# Patient Record
Sex: Female | Born: 1955 | Race: White | Hispanic: No | Marital: Single | Smoking: Never smoker
Health system: Southern US, Community
[De-identification: ages and names within clinical notes are randomized; demographics above are authoritative.]

## PROBLEM LIST (undated history)

## (undated) DIAGNOSIS — M810 Age-related osteoporosis without current pathological fracture: Secondary | ICD-10-CM

## (undated) DIAGNOSIS — G4730 Sleep apnea, unspecified: Secondary | ICD-10-CM

## (undated) DIAGNOSIS — E119 Type 2 diabetes mellitus without complications: Secondary | ICD-10-CM

## (undated) DIAGNOSIS — E079 Disorder of thyroid, unspecified: Secondary | ICD-10-CM

## (undated) DIAGNOSIS — J45909 Unspecified asthma, uncomplicated: Secondary | ICD-10-CM

## (undated) HISTORY — DX: Sleep apnea, unspecified: G47.30

## (undated) HISTORY — DX: Type 2 diabetes mellitus without complications: E11.9

## (undated) HISTORY — DX: Age-related osteoporosis without current pathological fracture: M81.0

## (undated) HISTORY — PX: TONSILLECTOMY AND ADENOIDECTOMY: SUR1326

## (undated) HISTORY — PX: APPENDECTOMY: SHX54

## (undated) HISTORY — PX: VEIN SURGERY: SHX48

## (undated) HISTORY — DX: Unspecified asthma, uncomplicated: J45.909

## (undated) HISTORY — PX: BREAST SURGERY: SHX581

## (undated) HISTORY — DX: Disorder of thyroid, unspecified: E07.9

## (undated) HISTORY — PX: ABDOMINAL HYSTERECTOMY: SHX81

---

## 2005-02-11 ENCOUNTER — Ambulatory Visit: Payer: Self-pay

## 2005-06-05 ENCOUNTER — Encounter: Payer: Self-pay | Admitting: Otolaryngology

## 2005-06-07 ENCOUNTER — Ambulatory Visit: Payer: Self-pay

## 2005-06-25 ENCOUNTER — Encounter: Payer: Self-pay | Admitting: Otolaryngology

## 2005-07-26 ENCOUNTER — Encounter: Payer: Self-pay | Admitting: Otolaryngology

## 2005-08-24 ENCOUNTER — Emergency Department: Payer: Self-pay | Admitting: Emergency Medicine

## 2005-08-24 ENCOUNTER — Other Ambulatory Visit: Payer: Self-pay

## 2005-09-02 ENCOUNTER — Ambulatory Visit: Payer: Self-pay | Admitting: Emergency Medicine

## 2005-09-02 IMAGING — US US CAROTID DUPLEX BILAT
1 series · 17 of 24 positions shown · non-contrast
Comparison: none

REASON FOR EXAM: Syncope
COMMENTS:

[Series 1: us carotid duplex bilat · 17 of 63 slices shown]
[im 1/63]
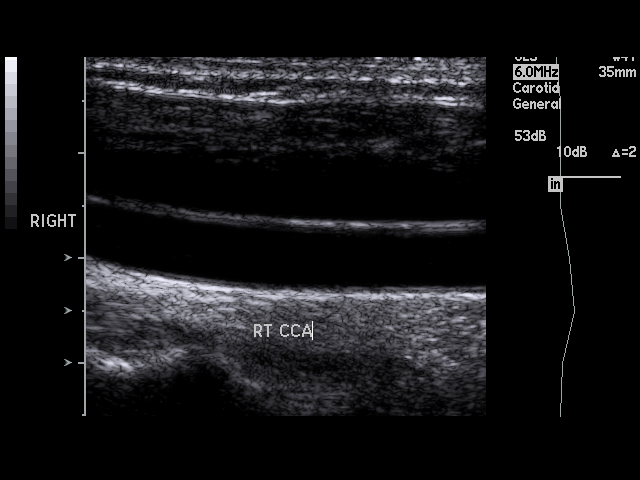
[im 6/63]
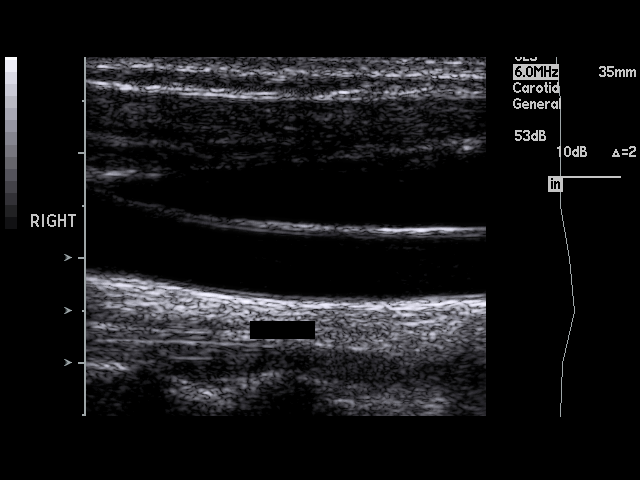
[im 9/63]
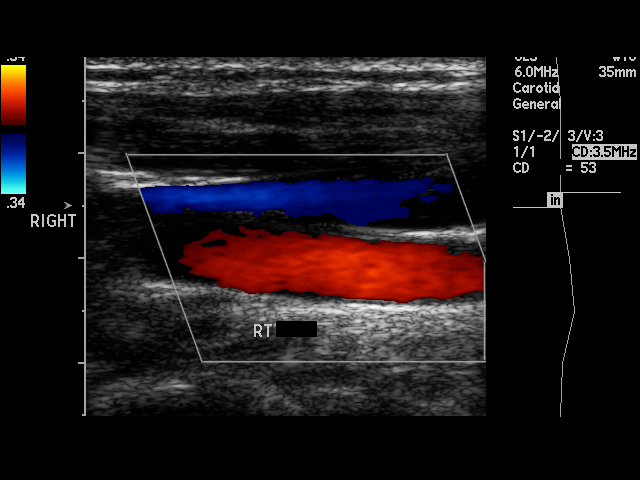
[im 11/63]
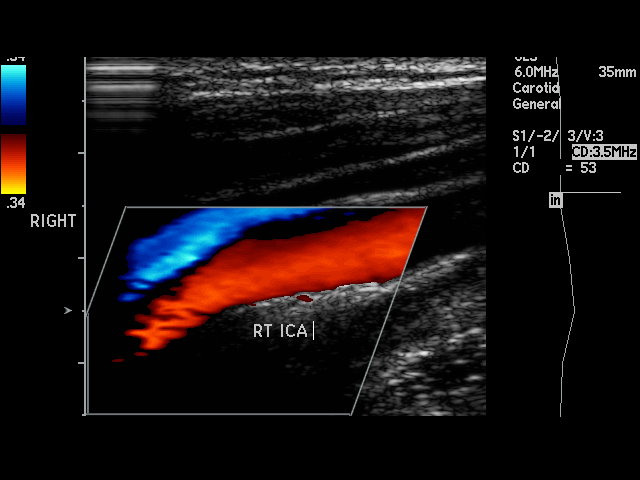
[im 17/63]
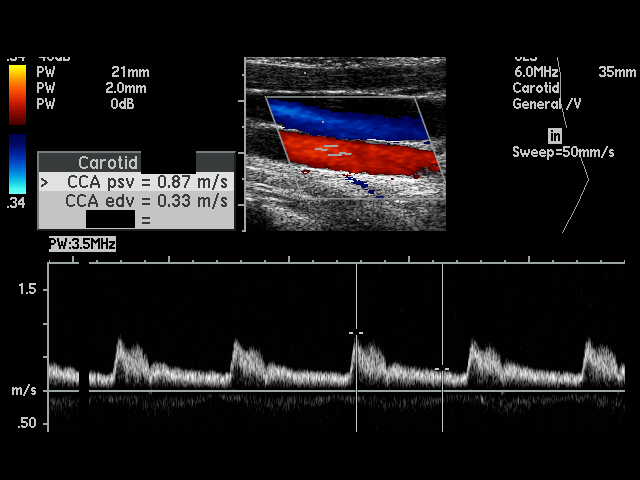
[im 19/63]
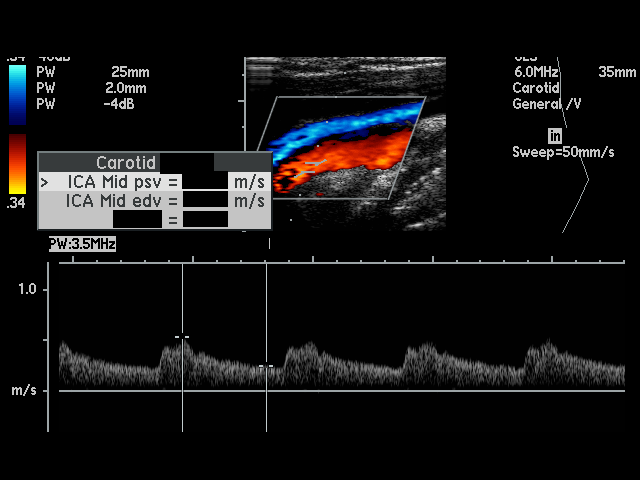
[im 25/63]
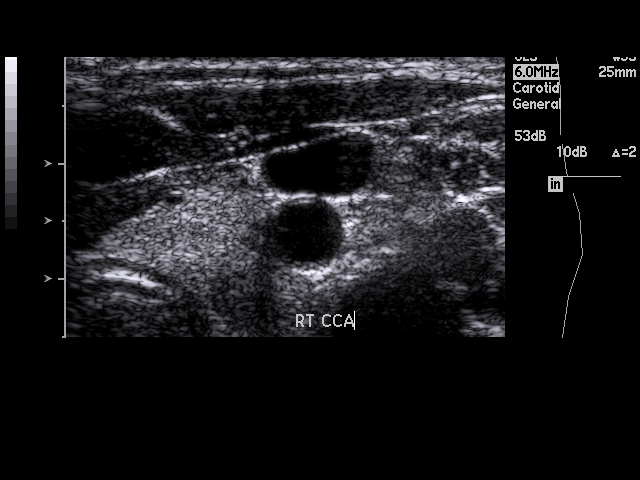
[im 27/63]
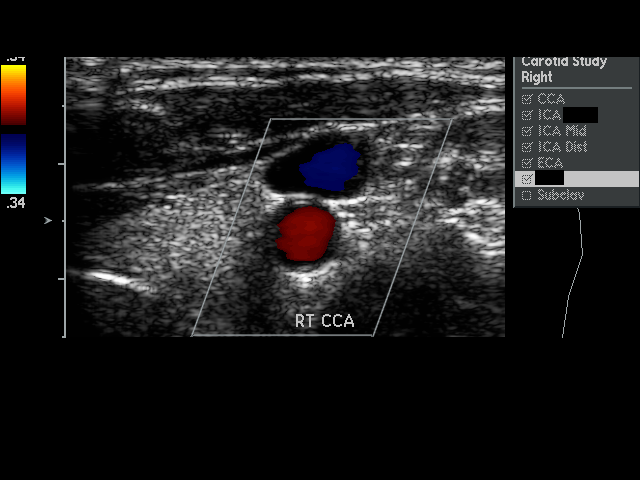
[im 33/63]
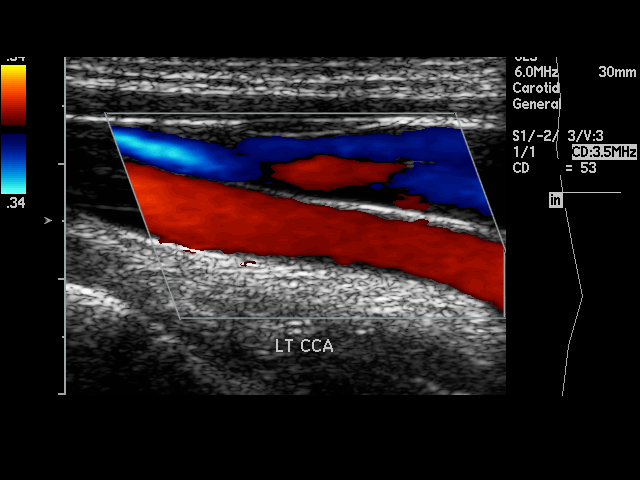
[im 36/63]
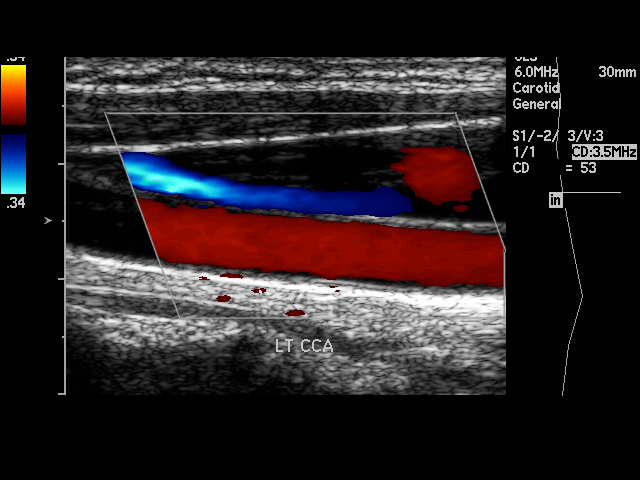
[im 38/63]
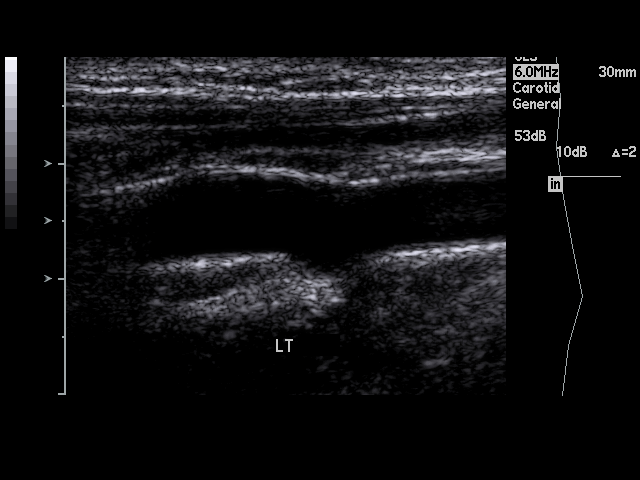
[im 44/63]
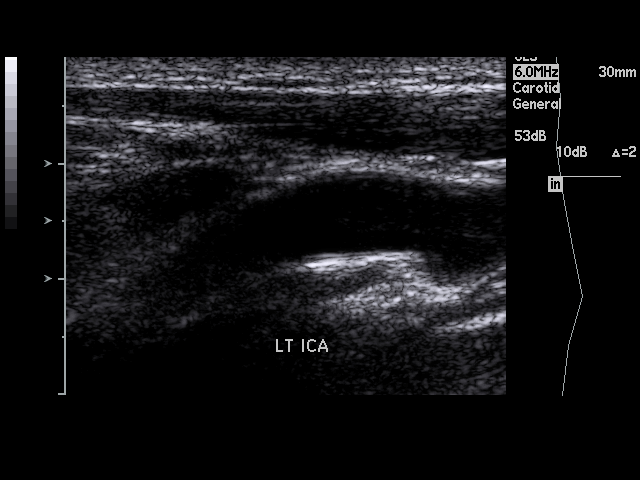
[im 46/63]
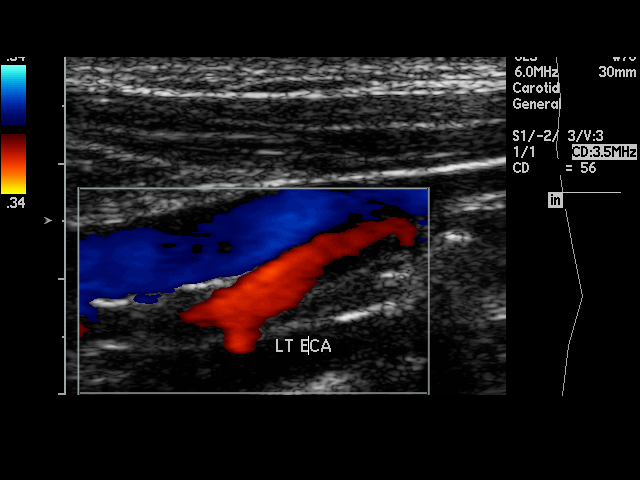
[im 52/63]
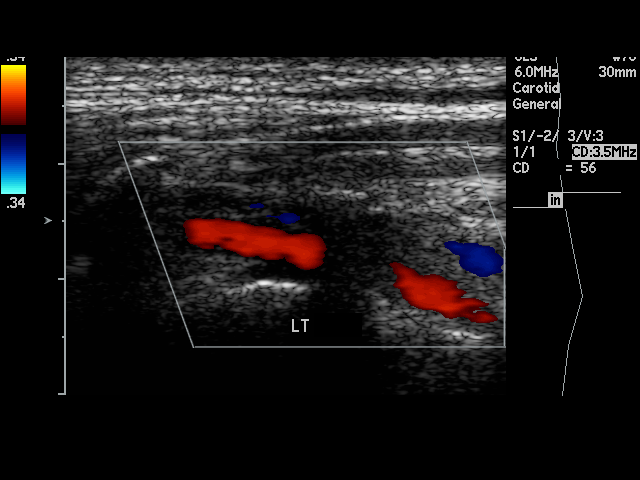
[im 54/63]
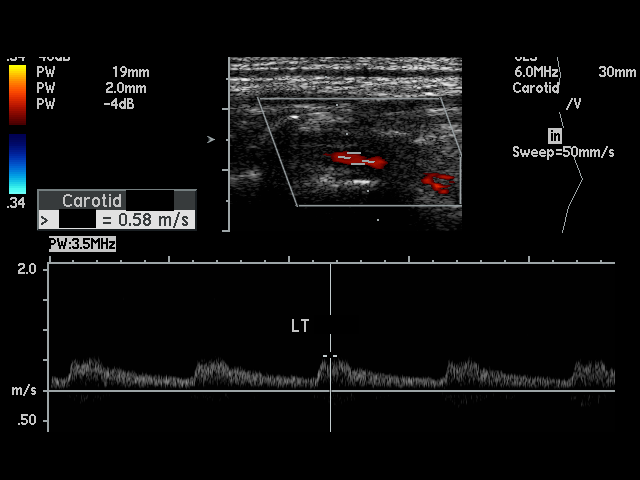
[im 57/63]
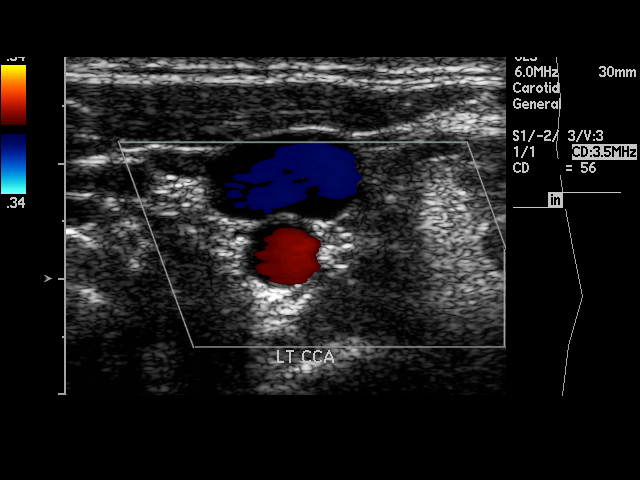
[im 63/63]
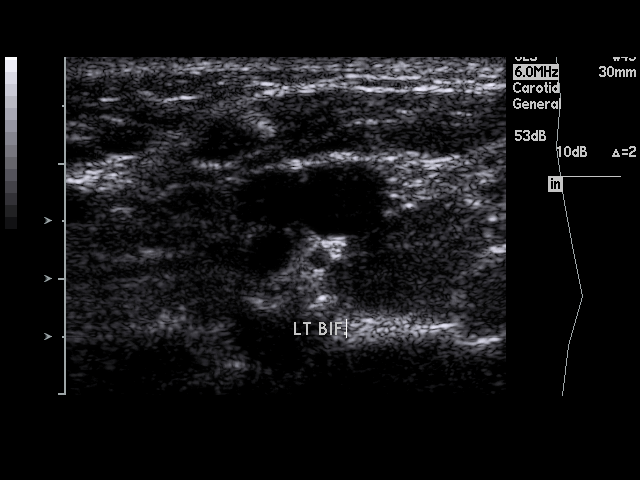

[17 of 24 positions shown; findings below may reference images not displayed]

PROCEDURE:     US  - US CAROTID DOPPLER BILATERAL  - [DATE] [DATE]

RESULT:     No definite plaque formation is seen on either side.  On the
RIGHT the peak RIGHT common carotid artery flow velocity measures .870
meters per second and the peak RIGHT internal carotid artery flow velocity
measures .577 meters per second.  ICA over CCA ratio is 0.663.  On the LEFT
the peak LEFT common carotid artery flow velocity measures .810 meters per
second and the peak LEFT internal carotid artery flow velocity measures .710
meters per second.  ICA over CCA ratio is .877.  These values bilaterally
are in the normal range and consistent with the absence of hemodynamically
significant stenosis.

Antegrade flow is noted in both vertebrals.
IMPRESSION: No plaque formation or stenosis is seen on either side.

Antegrade flow is noted in both vertebrals.

## 2006-03-05 ENCOUNTER — Ambulatory Visit: Payer: Self-pay

## 2007-04-20 ENCOUNTER — Ambulatory Visit: Payer: Self-pay

## 2007-07-31 ENCOUNTER — Ambulatory Visit: Payer: Self-pay | Admitting: Gastroenterology

## 2008-06-01 ENCOUNTER — Ambulatory Visit: Payer: Self-pay | Admitting: Unknown Physician Specialty

## 2008-06-07 ENCOUNTER — Ambulatory Visit: Payer: Self-pay | Admitting: Unknown Physician Specialty

## 2009-06-28 ENCOUNTER — Ambulatory Visit: Payer: Self-pay | Admitting: Unknown Physician Specialty

## 2009-07-10 ENCOUNTER — Ambulatory Visit: Payer: Self-pay | Admitting: Unknown Physician Specialty

## 2009-10-11 ENCOUNTER — Ambulatory Visit: Payer: Self-pay | Admitting: Unknown Physician Specialty

## 2009-10-26 ENCOUNTER — Ambulatory Visit: Payer: Self-pay | Admitting: Unknown Physician Specialty

## 2010-07-11 ENCOUNTER — Ambulatory Visit: Payer: Self-pay | Admitting: Unknown Physician Specialty

## 2011-07-20 ENCOUNTER — Ambulatory Visit: Payer: Self-pay | Admitting: Neurology

## 2011-09-19 ENCOUNTER — Ambulatory Visit: Payer: Self-pay | Admitting: Unknown Physician Specialty

## 2012-07-07 ENCOUNTER — Ambulatory Visit: Payer: Self-pay | Admitting: Internal Medicine

## 2012-07-07 LAB — CBC WITH DIFFERENTIAL/PLATELET
Basophil #: 0 10*3/uL (ref 0.0–0.1)
Basophil %: 0.3 %
Eosinophil #: 0.1 10*3/uL (ref 0.0–0.7)
HGB: 15.2 g/dL (ref 12.0–16.0)
MCH: 28.9 pg (ref 26.0–34.0)
MCHC: 33.6 g/dL (ref 32.0–36.0)
Monocyte #: 0.6 x10 3/mm (ref 0.2–0.9)
Neutrophil #: 5.1 10*3/uL (ref 1.4–6.5)
Neutrophil %: 74.3 %
Platelet: 173 10*3/uL (ref 150–440)
RBC: 5.27 10*6/uL — ABNORMAL HIGH (ref 3.80–5.20)
RDW: 12.4 % (ref 11.5–14.5)

## 2012-07-07 LAB — COMPREHENSIVE METABOLIC PANEL
Albumin: 3.6 g/dL (ref 3.4–5.0)
Alkaline Phosphatase: 118 U/L (ref 50–136)
Anion Gap: 7 (ref 7–16)
BUN: 14 mg/dL (ref 7–18)
Bilirubin,Total: 0.6 mg/dL (ref 0.2–1.0)
Co2: 30 mmol/L (ref 21–32)
Creatinine: 0.79 mg/dL (ref 0.60–1.30)
EGFR (Non-African Amer.): >60
Glucose: 95 mg/dL (ref 65–99)
Osmolality: 274 (ref 275–301)
SGPT (ALT): 23 U/L (ref 12–78)
Sodium: 137 mmol/L (ref 136–145)
Total Protein: 7.3 g/dL (ref 6.4–8.2)

## 2012-09-23 ENCOUNTER — Ambulatory Visit: Payer: Self-pay

## 2013-12-23 ENCOUNTER — Ambulatory Visit: Payer: Self-pay

## 2015-02-24 ENCOUNTER — Encounter: Payer: Self-pay | Admitting: Podiatry

## 2015-02-24 ENCOUNTER — Ambulatory Visit (INDEPENDENT_AMBULATORY_CARE_PROVIDER_SITE_OTHER): Payer: BC Managed Care – PPO | Admitting: Podiatry

## 2015-02-24 ENCOUNTER — Ambulatory Visit (INDEPENDENT_AMBULATORY_CARE_PROVIDER_SITE_OTHER): Payer: BC Managed Care – PPO

## 2015-02-24 VITALS — BP 118/70 | HR 62 | Resp 16 | Ht 63.75 in | Wt 158.0 lb

## 2015-02-24 DIAGNOSIS — M779 Enthesopathy, unspecified: Secondary | ICD-10-CM

## 2015-02-24 DIAGNOSIS — M2011 Hallux valgus (acquired), right foot: Secondary | ICD-10-CM

## 2015-02-24 MED ORDER — TRIAMCINOLONE ACETONIDE 10 MG/ML IJ SUSP
10.0000 mg | Freq: Once | INTRAMUSCULAR | Status: AC
  Administered 2015-02-24: 10 mg

## 2015-02-24 NOTE — Progress Notes (Signed)
   Subjective:    Patient ID: Kelsey Howard, female    DOB: 07/14/1956, 59 y.o.   MRN: [id number]  HPI right foot having a ache, that has been going on for years. Right foot bunion     Review of Systems  All other systems reviewed and are negative.      Objective:   Physical Exam        Assessment & Plan:

## 2015-02-27 NOTE — Progress Notes (Signed)
Subjective:     Patient ID: Kelsey Howard, female   DOB: 03/13/1956, 59 y.o.   MRN: [id number]  HPI patient presents stating I have this structural bunion deformity right which is becoming more bothersome and I'm having a lot of pain on the inside of the joint with no history of injury. States it's been present for several months and the achiness around the first metatarsal head has been present for a fairly long time   Review of Systems  All other systems reviewed and are negative.      Objective:   Physical Exam  Constitutional: She is oriented to person, place, and time.  Cardiovascular: Intact distal pulses.   Musculoskeletal: Normal range of motion.  Neurological: She is oriented to person, place, and time.  Skin: Skin is warm.  Nursing note and vitals reviewed.  neurovascular status intact muscle strength adequate with range of motion subtalar and midtarsal joint within normal limits. Patient's noted to have hyperostosis medial aspect first metatarsal head right that's painful when pressed and makes shoe gear difficult and is red upon palpation with pain. 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 I do think with an aggressive Austin-type bunionectomy we could do a good job of helping with this problem and I explained surgery and the fact we would not get total correction but would do well. She wants to do this but needs to wait until summer and at this time I did do a careful injection of the first MPJ 3 mg Kenalog 5 mg Xylocaine lateral side and I want to reevaluate in several weeks and see the results and also discussed more the structural bunion procedure  necessary

## 2015-03-24 ENCOUNTER — Ambulatory Visit (INDEPENDENT_AMBULATORY_CARE_PROVIDER_SITE_OTHER): Payer: BC Managed Care – PPO | Admitting: Podiatry

## 2015-03-24 ENCOUNTER — Encounter: Payer: Self-pay | Admitting: Podiatry

## 2015-03-24 VITALS — BP 128/76 | HR 62 | Resp 16

## 2015-03-24 DIAGNOSIS — M2011 Hallux valgus (acquired), right foot: Secondary | ICD-10-CM | POA: Diagnosis not present

## 2015-03-25 NOTE — Progress Notes (Signed)
Subjective:     Patient ID: Kelsey CootsLinda H Howard, female   DOB: 01/18/1956, 59 y.o.   MRN: [id number]  HPI patient states the pain has subsided but I'm still concerned about the structural bunion deformity and I would like to pursue correction. States that it bothers her in shoe gear and is gradually getting worse   Review of Systems     Objective:   Physical Exam Neurovascular status intact with muscle strength adequate and range of motion within normal limits. Patient's first MPJ the inflammation present previously has reduced some with the injection treatment but she continues to have discomfort on the medial side with a prominence noted and deviation of the hallux against the second toe    Assessment:     Structural HAV deformity right with redness and pain around the head that improved some with injection treatment but remains symptomatic    Plan:     Reviewed condition with patient and discussed treatment options. Her IM angle indicates the thought of a more proximal osteotomy or aggressive distal osteotomy with the expectation that full correction would not be achieved, would probably reduced quite a bit of the symptoms she experiences. I will no longer be full time in the [place] office and I'm going to refer this patient to Dr. Al CorpusHyatt for review and consideration of surgical intervention

## 2015-03-29 ENCOUNTER — Ambulatory Visit (INDEPENDENT_AMBULATORY_CARE_PROVIDER_SITE_OTHER): Payer: BC Managed Care – PPO | Admitting: Podiatry

## 2015-03-29 ENCOUNTER — Encounter: Payer: Self-pay | Admitting: Podiatry

## 2015-03-29 VITALS — Ht 63.0 in | Wt 158.0 lb

## 2015-03-29 DIAGNOSIS — M2011 Hallux valgus (acquired), right foot: Secondary | ICD-10-CM | POA: Diagnosis not present

## 2015-03-29 NOTE — Patient Instructions (Signed)
Pre-Operative Instructions  Congratulations, you have decided to take an important step to improving your quality of life.  You can be assured that the doctors of [facility] will be with you every step of the way.  1. Plan to be at the surgery center/hospital at least 1 (one) hour prior to your scheduled time unless otherwise directed by the surgical center/hospital staff.  You must have a responsible adult accompany you, remain during the surgery and drive you home.  Make sure you have directions to the surgical center/hospital and know how to get there on time. 2. For hospital based surgery you will need to obtain a history and physical form from your family physician within 1 month prior to the date of surgery- we will give you a form for you primary physician.  3. We make every effort to accommodate the date you request for surgery.  There are however, times where surgery dates or times have to be moved.  We will contact you as soon as possible if a change in schedule is required.   4. No Aspirin/Ibuprofen for one week before surgery.  If you are on aspirin, any non-steroidal anti-inflammatory medications (Mobic, Aleve, Ibuprofen) you should stop taking it 7 days prior to your surgery.  You make take Tylenol  For pain prior to surgery.  5. Medications- If you are taking daily heart and blood pressure medications, seizure, reflux, allergy, asthma, anxiety, pain or diabetes medications, make sure the surgery center/hospital is aware before the day of surgery so they may notify you which medications to take or avoid the day of surgery. 6. No food or drink after midnight the night before surgery unless directed otherwise by surgical center/hospital staff. 7. No alcoholic beverages 24 hours prior to surgery.  No smoking 24 hours prior to or 24 hours after surgery. 8. Wear loose pants or shorts- loose enough to fit over bandages, boots, and casts. 9. No slip on shoes, sneakers are best. 10. Bring  your boot with you to the surgery center/hospital.  Also bring crutches or a walker if your physician has prescribed it for you.  If you do not have this equipment, it will be provided for you after surgery. 11. If you have not been contracted by the surgery center/hospital by the day before your surgery, call to confirm the date and time of your surgery. 12. Leave-time from work may vary depending on the type of surgery you have.  Appropriate arrangements should be made prior to surgery with your employer. 13. Prescriptions will be provided immediately following surgery by your doctor.  Have these filled as soon as possible after surgery and take the medication as directed. 14. Remove nail polish on the operative foot. 15. Wash the night before surgery.  The night before surgery wash the foot and leg well with the antibacterial soap provided and water paying special attention to beneath the toenails and in between the toes.  Rinse thoroughly with water and dry well with a towel.  Perform this wash unless told not to do so by your physician.  Enclosed: 1 Ice pack (please put in freezer the night before surgery)   1 Hibiclens skin cleaner   Pre-op Instructions  If you have any questions regarding the instructions, do not hesitate to call our office.  [place]: [address] [place], [place] [address] [phone]  [place]: [address] [place], [place] [address] [phone]  [place]: [address]  [place], [place] [address] [phone]  Dr. Richard   Tuchman DPM, Dr. Norman Regal DPM Dr. Richard Sikora DPM, Dr. M. Todd Hyatt DPM, Dr. Kathryn Egerton DPM 

## 2015-03-29 NOTE — Progress Notes (Signed)
She presents today for surgical consult regarding her bunion of her right foot. She states that this seems to be getting worse as time goes on his causing crowding of my lesser toes and I am developing sores between my toes. She states that she can hardly wear shoes that fit comfortably secondary to her severe deformity.  Objective: Vital signs are stable she is alert and oriented 3. Pulses are strongly palpable right foot. 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 Osteopenia is also noted. Patient does have a history of osteoporosis.  Assessment: Hallux abductovalgus deformity right foot. With pain associated with osteoarthritic changes.  Plan: Discussed etiology pathology conservative versus surgical therapies. We did discuss an [facility] bunion repair with screw fixation today. We went over the consent form line by line number by number giving her ample time to ask questions she saw fit regarding these procedures. I answered all of the questions regarding these procedures to the best of my ability in layman's terms. She understood it was amenable to it and find all 3 cages of the consent form. We dispensed a cam walker for her postop period. I will follow-up with her in the near future for surgical intervention.

## 2015-04-05 ENCOUNTER — Telehealth: Payer: Self-pay | Admitting: *Deleted

## 2015-04-05 NOTE — Telephone Encounter (Signed)
Called pt, and spoke to pt about her benefits.

## 2015-04-05 NOTE — Telephone Encounter (Signed)
Pt states she is returning a call from the [facility].

## 2015-04-05 NOTE — Telephone Encounter (Signed)
Jennifer-did you call this patient regarding surgical benefits?

## 2015-05-08 ENCOUNTER — Telehealth: Payer: Self-pay | Admitting: *Deleted

## 2015-05-08 NOTE — Telephone Encounter (Signed)
"  I haven't heard anything from the surgical center about my surgery.  Is there anything I need to do?  I'm scheduled for 05/19/2015."  No, the surgical center will call you a day or two before surgery date and let you know what time to be there.  "I know that.  Is there not any blood work or anything I need to do before hand?"  No blood work is needed.  "Well how do they know my blood type is if I need a blood transfusion or something?"  You will not need a blood transfusion for the type of procedure you are having.  You will need to fill out the information for the surgical center on-line.  "I don't have that information.  All I have is this brochure, nail scrub and ice pack."  The information for the surgical center is located in the brochure.  "Oh, I guest I should have checked the bag out more.  Thank you."

## 2015-05-17 ENCOUNTER — Other Ambulatory Visit: Payer: Self-pay | Admitting: Podiatry

## 2015-05-17 MED ORDER — OXYCODONE-ACETAMINOPHEN 10-325 MG PO TABS: 1.0000 | ORAL_TABLET | ORAL | Status: DC | PRN

## 2015-05-17 MED ORDER — CLINDAMYCIN HCL 150 MG PO CAPS: 150.0000 mg | ORAL_CAPSULE | Freq: Three times a day (TID) | ORAL | Status: DC

## 2015-05-17 MED ORDER — PROMETHAZINE HCL 25 MG PO TABS: 25.0000 mg | ORAL_TABLET | Freq: Three times a day (TID) | ORAL | Status: DC | PRN

## 2015-05-19 DIAGNOSIS — M2011 Hallux valgus (acquired), right foot: Secondary | ICD-10-CM | POA: Diagnosis not present

## 2015-05-22 ENCOUNTER — Telehealth: Payer: Self-pay | Admitting: *Deleted

## 2015-05-22 NOTE — Telephone Encounter (Signed)
I spoke with pt she states this weekend the pain medication gave her a rash and she called the on-call doctor, who told her to take OTC Tylenol or Ibuprofen for pain.  Pt states she took 1/2 of the pain medication and the nausea medication and is very confused and tired.  I asked the pt if she felt her pain could be managed with the Ibuprofen, and she said she could take Ibuprofen 800mg  3 times a day and it helped, I told her if she continued to have a itching rash to OTC Benadryl as directed.  I told pt I would inform Dr. Al Corpus of her medication change and for her to call with concerns.

## 2015-05-22 NOTE — Telephone Encounter (Signed)
fyi

## 2015-05-24 ENCOUNTER — Ambulatory Visit (INDEPENDENT_AMBULATORY_CARE_PROVIDER_SITE_OTHER): Payer: BC Managed Care – PPO | Admitting: Podiatry

## 2015-05-24 ENCOUNTER — Ambulatory Visit (INDEPENDENT_AMBULATORY_CARE_PROVIDER_SITE_OTHER): Payer: BC Managed Care – PPO

## 2015-05-24 VITALS — BP 122/82 | HR 69 | Temp 97.1°F | Resp 16

## 2015-05-24 DIAGNOSIS — Z9889 Other specified postprocedural states: Secondary | ICD-10-CM

## 2015-05-24 DIAGNOSIS — M2011 Hallux valgus (acquired), right foot: Secondary | ICD-10-CM

## 2015-05-25 NOTE — Progress Notes (Signed)
She presents today for follow-up of her bunion surgery performed 05/19/2015. She states that she seems to be doing pretty well even though she was unable to take the medication because of a skin reaction. She denies fever chills nausea vomiting muscle aches and pains.  Objective: Vital signs are stable she is alert and oriented 3. She presents with her Cam Walker and a dry sterile compressive dressing. Once the dressing was removed demonstrates minimal edema no erythema cellulitis drainage or odor. She has a good range of motion of the first metatarsal phalangeal joint of the right foot. Read vascular confirm nice placement of the osteotomy with good screw fixation.  Assessment: Well-healing surgical foot status post one week.  Plan: Redressed the right foot today with a dry sterile compressive dressing encouraged her to continue range of motion exercises and placed her back in her Cam Walker. Follow up with her in 1 week

## 2015-05-31 ENCOUNTER — Encounter: Payer: BC Managed Care – PPO | Admitting: Podiatry

## 2015-06-02 ENCOUNTER — Ambulatory Visit (INDEPENDENT_AMBULATORY_CARE_PROVIDER_SITE_OTHER): Payer: BC Managed Care – PPO | Admitting: Podiatry

## 2015-06-02 VITALS — BP 127/76 | HR 68 | Temp 97.3°F | Resp 16

## 2015-06-02 DIAGNOSIS — Z9889 Other specified postprocedural states: Secondary | ICD-10-CM

## 2015-06-02 NOTE — Progress Notes (Signed)
She presents today status post [facility] with screw fixation right foot. She states that she has some stabbing pain when she is on it and the worst part is the boot rubs the back of her heel. She denies fever chills nausea vomiting muscle aches and pains.  Objective: Vital signs are stable alert and oriented 3. Dry sterile dressing was removed demonstrated some green drainage at the incision site where the area appears to be overall due to maceration and blistering. This is more than likely associated with the dressing itself. I see no signs of infection and that there is no erythema no cellulitis drainage or odor actively draining.  Assessment: Well-healing surgical foot right on the leave the stitches in the cause of the blistering and the maceration.  Plan: Redress today with a Betadine dressing and encouraged her to increase range of motion. Keep this clean and dry dispensed a Darco shoe and I will follow-up with her in 2 weeks.

## 2015-06-08 ENCOUNTER — Telehealth: Payer: Self-pay | Admitting: *Deleted

## 2015-06-08 MED ORDER — CLINDAMYCIN HCL 300 MG PO CAPS: 300.0000 mg | ORAL_CAPSULE | Freq: Three times a day (TID) | ORAL | Status: DC

## 2015-06-08 NOTE — Telephone Encounter (Signed)
OFFERED HER AN APPOINTMENT IN [place], SHE STATED THAT SHE WILL TAKE THE ANTIBIOTIC AND WAIT TO SEE DR Al CorpusHYATT ON July 25TH

## 2015-06-08 NOTE — Telephone Encounter (Signed)
PATIENT IS HAVING REDNESS AND SWELLING , TOES STILL FEEL NUMB AND TINGLING. NOT RUNNING A FEVER , PATIENT IS CHANGING DRESSING EVERY OTHER DAY AND USING NEOSPORIN ON IT. NO DRAINAGE , BUT IT FEELS WARM , HAVE NOT GOT IT WET.

## 2015-06-08 NOTE — Telephone Encounter (Signed)
Pt left her name, DOB and phone number.

## 2015-06-19 ENCOUNTER — Ambulatory Visit (INDEPENDENT_AMBULATORY_CARE_PROVIDER_SITE_OTHER): Payer: BC Managed Care – PPO

## 2015-06-19 ENCOUNTER — Encounter: Payer: Self-pay | Admitting: Podiatry

## 2015-06-19 ENCOUNTER — Ambulatory Visit (INDEPENDENT_AMBULATORY_CARE_PROVIDER_SITE_OTHER): Payer: BC Managed Care – PPO | Admitting: Podiatry

## 2015-06-19 VITALS — BP 115/59 | HR 68 | Resp 16

## 2015-06-19 DIAGNOSIS — Z9889 Other specified postprocedural states: Secondary | ICD-10-CM

## 2015-06-19 DIAGNOSIS — M2011 Hallux valgus (acquired), right foot: Secondary | ICD-10-CM

## 2015-06-19 NOTE — Progress Notes (Signed)
She presents today 1 month status post [facility] bunion repair with screw fixation right foot. She states this seems to be doing pretty well.  Objective: Final signs are stable she is alert and oriented 3. Pulses are palpable. Minimal edema right range of motion of the first metatarsophalangeal joint. Well-healed surgical site. Remaining sutures were removed. No signs of dehiscence.  Assessment: Well-healing surgical foot one month status post [place] bunion repair with screw fixation date of surgery 05/19/2015.  Plan: Put her in a compression anklet today and suggested that she get back into a pair of tennis shoes and increase her range of motion activities. I will follow-up with her in 1 month for x-rays.

## 2015-06-21 ENCOUNTER — Encounter: Payer: Self-pay | Admitting: Podiatry

## 2015-07-06 ENCOUNTER — Telehealth: Payer: Self-pay | Admitting: *Deleted

## 2015-07-06 NOTE — Telephone Encounter (Signed)
Pt called states she is still having a lot of swelling in the joint.  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 Al Corpus would release her to a seated position or should she make an earlier appt to see what she could tell her manager she could do; I told her that would be a good idea and to also ask the manager what her work options are. Transferred pt to schedulers.

## 2015-07-17 ENCOUNTER — Ambulatory Visit (INDEPENDENT_AMBULATORY_CARE_PROVIDER_SITE_OTHER): Payer: BC Managed Care – PPO | Admitting: Podiatry

## 2015-07-17 ENCOUNTER — Encounter: Payer: Self-pay | Admitting: Podiatry

## 2015-07-17 ENCOUNTER — Ambulatory Visit (INDEPENDENT_AMBULATORY_CARE_PROVIDER_SITE_OTHER): Payer: BC Managed Care – PPO

## 2015-07-17 VITALS — BP 119/69 | HR 71 | Resp 16

## 2015-07-17 DIAGNOSIS — Z9889 Other specified postprocedural states: Secondary | ICD-10-CM

## 2015-07-17 DIAGNOSIS — M2011 Hallux valgus (acquired), right foot: Secondary | ICD-10-CM | POA: Diagnosis not present

## 2015-07-17 NOTE — Progress Notes (Signed)
She presents today for follow-up of her [facility] bunion repair right foot. She states that she is ready to get back to work this Thursday. She states that she still has some numbness and swelling in the right foot but she is able to wear her shoe.  Objective: Vital signs are stable she is alert and oriented 3 in no acute distress. Pulses are strongly palpable right. Minimal edema right foot. She has good range of motion first metatarsophalangeal joint of the right foot. Radiographs confirm an [facility] bunion repair with single screw fixation first metatarsal right foot. It appears to have healed without complications.  Assessment: Well-healing surgical foot date of surgery 05/19/2015 right.  Plan: At this point and would allow her to get back to work unrestricted she will work 45 minutes out of each hour and the remaining 15 minutes will result in elevation of the foot with icing of the foot. Follow up with her in 1 month.  Arbutus Ped DPM

## 2015-08-21 ENCOUNTER — Encounter: Payer: Self-pay | Admitting: Podiatry

## 2015-08-21 ENCOUNTER — Ambulatory Visit (INDEPENDENT_AMBULATORY_CARE_PROVIDER_SITE_OTHER): Payer: BC Managed Care – PPO | Admitting: Podiatry

## 2015-08-21 ENCOUNTER — Ambulatory Visit: Payer: Self-pay

## 2015-08-21 VITALS — BP 118/74 | HR 77 | Resp 12

## 2015-08-21 DIAGNOSIS — M2011 Hallux valgus (acquired), right foot: Secondary | ICD-10-CM

## 2015-08-21 NOTE — Progress Notes (Signed)
She presents today for her final follow-up visit regarding an [facility] bunion repair right foot. She states that she wears to compression stockings to the right foot. States that she's walks overnight and thousand steps a day. She states that initially when she went back to work it was sore but then it has progressed. She states that seems to be doing very well she has no complications.  Objective: Vital signs are stable she is alert and oriented 3. She has minimal edema no erythema cellulitis drainage or odor. Great range of motion of the first metatarsophalangeal joint. Mild hypertrophic scar overlying the incision site. Radiographs taken today 3 views of the office demonstrates well-healed surgical foot. Cutaneous evaluation demonstrates supple well-hydrated cutis no signs of infection.  Assessment: Well-healing surgical foot date of surgery 05/18/2015.  Plan: Follow-up as needed.

## 2015-09-26 ENCOUNTER — Telehealth: Payer: Self-pay | Admitting: *Deleted

## 2015-09-26 NOTE — Telephone Encounter (Signed)
Pt states she still feels like the right 1st joint hits first when she steps down while walking barefooted.  I told pt it may take 6-9 months for her to be able to walk for short periods of time barefooted, but to use a firm shoe and try short periods of barefoot walking.  Pt states understanding.

## 2015-10-09 ENCOUNTER — Telehealth: Payer: Self-pay | Admitting: *Deleted

## 2015-10-09 NOTE — Telephone Encounter (Signed)
Pt states she still can't go barefooted, she said Dr. Al CorpusHyatt states she can wear Teva sandal, which she bought but still has achiness.  I told pt I would have the scheduler call tomorrow and get her into discuss and bring her sandals.

## 2015-10-24 ENCOUNTER — Ambulatory Visit (INDEPENDENT_AMBULATORY_CARE_PROVIDER_SITE_OTHER): Payer: BC Managed Care – PPO

## 2015-10-24 ENCOUNTER — Telehealth: Payer: Self-pay | Admitting: *Deleted

## 2015-10-24 ENCOUNTER — Encounter: Payer: Self-pay | Admitting: Podiatry

## 2015-10-24 ENCOUNTER — Ambulatory Visit (INDEPENDENT_AMBULATORY_CARE_PROVIDER_SITE_OTHER): Payer: BC Managed Care – PPO | Admitting: Podiatry

## 2015-10-24 VITALS — BP 113/69 | HR 75 | Resp 16

## 2015-10-24 DIAGNOSIS — Z9889 Other specified postprocedural states: Secondary | ICD-10-CM

## 2015-10-24 DIAGNOSIS — M2011 Hallux valgus (acquired), right foot: Secondary | ICD-10-CM

## 2015-10-24 DIAGNOSIS — M2580 Other specified joint disorders, unspecified joint: Secondary | ICD-10-CM

## 2015-10-24 NOTE — Progress Notes (Signed)
She presents today for chief complaint of pain beneath first metatarsophalangeal joint of the right foot. Surgery was performed to repair a bunion to this right foot in June 2016. She states that it feels she is walking on a rock the toe does not purchase the ground. He easily.  Objective: Vital signs are stable she's alert and oriented 3. She has painful and very prominent tibial sesamoid of the hallux right. Radiographs confirm osteoarthritic changes tibial sesamoid right foot. Some early joint space narrowing also noted of the joint. Mild malleus deformity of the hallux.  Assessment: Status post bunion repair with osteoarthritic changes tibial sesamoid right foot. Hallux malleus right foot.  Plan: Discussed etiology pathology conservative versus surgical therapies. She was scanned for set of orthotics today should this fail surgery will be eminent.  Arbutus Pedodd Hyatt DPM

## 2015-10-24 NOTE — Telephone Encounter (Signed)
Pt states she forgot to get her work note.

## 2015-10-25 NOTE — Telephone Encounter (Addendum)
Pt request note for work, but did not leave information as to what the note should contain.  Left message informing pt a note could be written, once the required information was called to me.  Pt called states she just needs a note stating she was here 10/23/2105, and fax to [facility] [employer] ATTN:  Bard HerbertCandy [place] [phone]. Done.

## 2015-11-07 ENCOUNTER — Ambulatory Visit: Payer: BC Managed Care – PPO | Admitting: Podiatry

## 2015-11-16 ENCOUNTER — Ambulatory Visit: Payer: BC Managed Care – PPO | Admitting: *Deleted

## 2015-11-16 DIAGNOSIS — M779 Enthesopathy, unspecified: Secondary | ICD-10-CM

## 2015-11-16 NOTE — Patient Instructions (Signed)

## 2015-11-16 NOTE — Progress Notes (Signed)
Patient ID: Marliss CootsLinda H Warchol, female   DOB: 10/16/1956, 59 y.o.   MRN: [id number] Patient presents for orthotic pick up.  Verbal and written break in and wear instructions given.  Patient will follow up in 4 weeks if symptoms worsen or fail to improve.

## 2020-01-05 ENCOUNTER — Ambulatory Visit
Admission: EM | Admit: 2020-01-05 | Discharge: 2020-01-05 | Disposition: A | Discharge status: Home / Self Care | Payer: BC Managed Care – PPO | Attending: Urgent Care | Admitting: Urgent Care

## 2020-01-05 ENCOUNTER — Other Ambulatory Visit: Payer: Self-pay

## 2020-01-05 DIAGNOSIS — N390 Urinary tract infection, site not specified: Secondary | ICD-10-CM | POA: Insufficient documentation

## 2020-01-05 DIAGNOSIS — F4321 Adjustment disorder with depressed mood: Secondary | ICD-10-CM | POA: Diagnosis present

## 2020-01-05 LAB — URINALYSIS, COMPLETE (UACMP) WITH MICROSCOPIC
Bilirubin Urine: NEGATIVE
Glucose, UA: NEGATIVE mg/dL
Ketones, ur: 15 mg/dL — AB
Nitrite: NEGATIVE
Protein, ur: 100 mg/dL — AB
Specific Gravity, Urine: 1.015 (ref 1.005–1.030)
WBC, UA: >50 WBC/hpf (ref 0–5)
pH: 7 (ref 5.0–8.0)

## 2020-01-05 MED ORDER — NITROFURANTOIN MONOHYD MACRO 100 MG PO CAPS: 100.0000 mg | ORAL_CAPSULE | Freq: Two times a day (BID) | ORAL | 0 refills | Status: DC

## 2020-01-05 NOTE — ED Triage Notes (Signed)
Pt presents with c/o urinary urgency, decreased output, incontinence x2, lower abdominal pressure. Pt has been taking Azo since last night and that has helped some. She denies fever/chills, n/v/d, hematuria or dysuria.

## 2020-01-05 NOTE — Discharge Instructions (Addendum)
It was very nice seeing you today in clinic. Thank you for entrusting me with your care.   As discussed, your urine is POSITIVE for infection. Will approach treatment as follows:  Prescription has been sent to your pharmacy for antibiotics.  Please pick up and take as directed. FINISH the entire course of medication even if you are feeling better.  A culture will be sent on your provided sample. If it comes back resistant to what I have prescribed you, someone will call you and let you know that we will need to change antibiotics. Increase fluid intake as much as possible to flush your urinary tract.  Water is always the best.  Avoid caffeine until your infection clears up, as it can contribute to painful bladder spasms.  May use Tylenol and/or Ibuprofen as needed for pain/fever. May use Azo products (over the counter) to help with pain/discomfort.   Make arrangements to follow up with your regular doctor in 1 week for re-evaluation. If your symptoms/condition worsens, please seek follow up care either here or in the ER. Please remember, our [place] providers are "right here with you" when you need us.   Again, it was my pleasure to take care of you today. Thank you for choosing our clinic. I hope that you start to feel better quickly.   Fae Blossom, MSN, APRN, FNP-C, CEN Advanced Practice Provider [facility] 

## 2020-01-06 NOTE — ED Provider Notes (Signed)
[place], [place]   Name: Kelsey Howard DOB: 1956-07-23 MRN: [id number] CSN: [id number] PCP: Jeannie Done, MD  Arrival date and time:  01/05/20 1815  Chief Complaint:  Urinary Urgency   NOTE: Prior to seeing the patient today, I have reviewed the triage nursing documentation and vital signs. Clinical staff has updated patient's PMH/PSHx, current medication list, and drug allergies/intolerances to ensure comprehensive history available to assist in medical decision making.   History:   HPI: Kelsey Howard is a 64 y.o. female who presents today with complaints of urinary symptoms that began with acute onset 1 week ago. She complains of mainly frequency and urgency. She denies dysuria. Patient reports that she is only voiding small amounts. She has experienced 2 recent episodes of urinary incontinence, which has never happened to her before. She has not appreciated any gross hematuria, nor has she noticed her urine being malodorous. Patient denies any associated nausea, vomiting, fever, or chills. She has not experienced any pain in her lower back, flank area, or abdomen. Patient advises that she does not have a past medical history that is significant for recurrent urinary tract infections. She denies any vaginal pain, bleeding, or discharge. Patient with flat affect in clinic with periods of crying citing grief related the recent loss of her husband to SARS-CoV-2 (novel coronavirus).   Past Medical History:  Diagnosis Date   Asthma    Diabetes mellitus without complication (HCC)    Osteoporosis    Sleep apnea    Thyroid disease     Past Surgical History:  Procedure Laterality Date   ABDOMINAL HYSTERECTOMY     APPENDECTOMY     BREAST SURGERY     CESAREAN SECTION     TONSILLECTOMY AND ADENOIDECTOMY     VEIN SURGERY      Family History  Problem Relation Age of Onset   COPD Mother    Colon cancer Father     Social History   Tobacco Use   Smoking status: Never  Smoker   Smokeless tobacco: Never Used  Substance Use Topics   Alcohol use: Never    Alcohol/week: 0.0 standard drinks   Drug use: Never    There are no problems to display for this patient.   Home Medications:    Current Meds  Medication Sig   Calcium-Vitamin D-Vitamin K (VIACTIV CALCIUM PLUS D PO) Take by mouth.   fexofenadine (ALLEGRA) 180 MG tablet Take 180 mg by mouth daily.   levothyroxine (SYNTHROID, LEVOTHROID) 50 MCG tablet    Magnesium 250 MG TABS Take by mouth.   mometasone-formoterol (DULERA) 100-5 MCG/ACT AERO Inhale 2 puffs into the lungs 2 (two) times daily.   Multiple Vitamins-Minerals (CENTRUM SILVER PO) Take by mouth.   zinc gluconate 50 MG tablet Take 50 mg by mouth daily.   [DISCONTINUED] b complex vitamins capsule Take 1 capsule by mouth daily.   [DISCONTINUED] Calcium Carbonate (CALCIUM 600 PO) Take by mouth.   [DISCONTINUED] Cholecalciferol (VITAMIN D-3) 1000 UNITS CAPS Take by mouth.    Allergies:   Amoxicillin, Gatifloxacin, Oxycodone, and Percocet [oxycodone-acetaminophen]  Review of Systems (ROS): Review of Systems  Constitutional: Negative for chills and fever.  Respiratory: Negative for cough and shortness of breath.   Cardiovascular: Negative for chest pain and palpitations.  Gastrointestinal: Negative for abdominal pain, nausea and vomiting.  Genitourinary: Positive for decreased urine volume, frequency and urgency. Negative for dysuria, hematuria, pelvic pain, vaginal bleeding, vaginal discharge and vaginal pain.       (+)  episodes of urinary incontinence.   Musculoskeletal: Negative for back pain.  Skin: Negative for color change, pallor and rash.  Neurological: Negative for dizziness, syncope, weakness and headaches.  Psychiatric/Behavioral: Positive for dysphoric mood (2/2 loss of spouse).  All other systems reviewed and are negative.    Vital Signs: Today's Vitals   01/05/20 1835 01/05/20 1845  BP:  136/68  Pulse:  65   Temp:  97.8 F (36.6 C)  TempSrc:  Oral  SpO2:  100%  Weight: 158 lb (71.7 kg)   Height: 5\' 3"  (1.6 m)   PainSc: 0-No pain     Physical Exam: Physical Exam  Constitutional: She is oriented to person, place, and time and well-developed, well-nourished, and in no distress.  HENT:  Head: Normocephalic and atraumatic.  Eyes: Pupils are equal, round, and reactive to light.  Cardiovascular: Normal rate, regular rhythm, normal heart sounds and intact distal pulses.  Pulmonary/Chest: Effort normal and breath sounds normal.  Abdominal: Soft. Normal appearance and bowel sounds are normal. She exhibits no distension. There is no abdominal tenderness. There is no CVA tenderness.  Neurological: She is alert and oriented to person, place, and time. Gait normal.  Skin: Skin is warm and dry. No rash noted. She is not diaphoretic.  Psychiatric: Memory and judgment normal. She exhibits a depressed mood. She has a flat affect.  Periods of crying as patient recounts the events leading to her husband's death from SARS-CoV-2; grieving appropriately with no SI/HI.   Nursing note and vitals reviewed.   Urgent Care Treatments / Results:   Orders Placed This Encounter  Procedures   Urine culture   Urinalysis, Complete w Microscopic    LABS: PLEASE NOTE: all labs that were ordered this encounter are listed, however only abnormal results are displayed. Labs Reviewed  URINALYSIS, COMPLETE (UACMP) WITH MICROSCOPIC - Abnormal; Notable for the following components:      Result Value   APPearance HAZY (*)    Hgb urine dipstick LARGE (*)    Ketones, ur 15 (*)    Protein, ur 100 (*)    Leukocytes,Ua MODERATE (*)    Bacteria, UA FEW (*)    All other components within normal limits  URINE CULTURE    EKG: -None  RADIOLOGY: No results found.  PROCEDURES: Procedures  MEDICATIONS RECEIVED THIS VISIT: Medications - No data to display  PERTINENT CLINICAL COURSE NOTES/UPDATES:   Initial  Impression / Assessment and Plan / Urgent Care Course:  Pertinent labs & imaging results that were available during my care of the patient were personally reviewed by me and considered in my medical decision making (see lab/imaging section of note for values and interpretations).  Kelsey Howard is a 64 y.o. female who presents to [facility] today with complaints of Urinary Urgency  Patient is well appearing overall in clinic today. She does not appear to be in any acute distress. Presenting symptoms (see HPI) and exam as documented above. UA was (+) for infection; reflex culture sent. Will treat with a 5 day course of nitrofurantoin. Patient encouraged to complete the entire course of antibiotics even if she begins to feel better. She was advised that if culture demonstrates resistance to the prescribed antibiotic, she will be contacted and advised of the need to change the antibiotic being used to treat her infection. Patient encouraged to increase her fluid intake as much as possible. Discussed that water is always best to flush the urinary tract. She was advised to avoid caffeine  containing fluids until her infections clears, as caffeine can cause her to experience painful bladder spasms. May use Tylenol and/or Ibuprofen as needed for pain/fever.   Patient presents today with flat affect. (+) periods of crying in clinic as she speaks of the recent death of her husband after he contracted SARS-CoV-2 (novel coronavirus). Patient with (+) depressive symptoms, however verbalizes no SI or HI. Loss of spouse is recent, thus she is felt to be grieving appropriately at this point. Condolences offered by clinical staff today. Patient may benefit from grief counseling/therapy and/or medical management of her grief if not improving. Will defer to PCP, as this is the first time that I am meeting her, and the aforementioned interventions will required ongoing monitoring and follow up.   Discussed follow up  with primary care physician in 1 week for re-evaluation. I have reviewed the follow up and strict return precautions for any new or worsening symptoms. Patient is aware of symptoms that would be deemed urgent/emergent, and would thus require further evaluation either here or in the emergency department. At the time of discharge, she verbalized understanding and consent with the discharge plan as it was reviewed with her. All questions were fielded by provider and/or clinic staff prior to patient discharge.    Final Clinical Impressions / Urgent Care Diagnoses:   Final diagnoses:  Urinary tract infection without hematuria, site unspecified  Grief    New Prescriptions:  [place] Controlled Substance Registry consulted? Not Applicable  Meds ordered this encounter  Medications   nitrofurantoin, macrocrystal-monohydrate, (MACROBID) 100 MG capsule    Sig: Take 1 capsule (100 mg total) by mouth 2 (two) times daily.    Dispense:  10 capsule    Refill:  0    Recommended Follow up Care:  Patient encouraged to follow up with the following provider within the specified time frame, or sooner as dictated by the severity of her symptoms. As always, she was instructed that for any urgent/emergent care needs, she should seek care either here or in the emergency department for more immediate evaluation.  Follow-up Information    Jeannie Done, MD In 1 week.   Specialty: Internal Medicine Why: General reassessment of symptoms if not improving Contact information: [address] [place] [address] [phone]         NOTE: This note was prepared using Dragon dictation software along with smaller phrase technology. Despite my best ability to proofread, there is the potential that transcriptional errors may still occur from this process, and are completely unintentional.    Verlee Monte, NP 01/06/20 1635

## 2020-01-07 LAB — URINE CULTURE: Culture: 80000 — AB

## 2020-12-11 ENCOUNTER — Ambulatory Visit: Payer: Self-pay

## 2022-03-05 NOTE — Progress Notes (Signed)
[facility] ?[address] ?[place], [place] [address] ? ?Pulmonary Sleep Medicine  ? ?Office Visit Note ? ?Patient Name: Kelsey Howard ?DOB: 04/07/1956 ?MRN [id number] ? ?[place] abx ? ?Chief Complaint: Obstructive Sleep Apnea visit ? ?Brief History: ? ?Kelsey Howard is seen today for an initial consult for APAP@ 8-12 cmH2O. The patient has a 8 year history of sleep apnea. Patient is using PAP nightly.  The patient feels for the most part rested after sleeping with PAP.  Lately in last few months the patient reports having a harder time getting going in the morning. The patient reports benefit from PAP use. Reported sleepiness was more improved until recently and the Epworth Sleepiness Score is 16 out of 24. The patient on occasion does take a short 20 min nap. The patient complains of the following: lately feeling tired in the morning and some sleepiness during the day she didn't have before.   The compliance download shows  98% compliance with an average use time of 8 hours. The AHI is 3.7  The patient does not complain of limb movements disrupting sleep. Patient goes to bed at 10:30pm and wakes at 5:30-6am. Prior to CPAP she reported gasping in her sleep and excessive daytime sleepiness. Her current machine is 65 years old and displaying a message that motor life is exceeded and it needs replacing. ? ?ROS ? ?General: (-) fever, (-) chills, (-) night sweat ?Nose and Sinuses: (-) nasal stuffiness or itchiness, (-) postnasal drip, (-) nosebleeds, (-) sinus trouble. ?Mouth and Throat: (-) sore throat, (-) hoarseness. ?Neck: (-) swollen glands, (-) enlarged thyroid, (-) neck pain. ?Respiratory: - cough, - shortness of breath, - wheezing. ?Neurologic: - numbness, - tingling. ?Psychiatric: - anxiety, - depression ? ? ?Current Medication: ?Outpatient Encounter Medications as of 03/06/2022  ?Medication Sig Note  ? alendronate (FOSAMAX) 70 MG tablet TAKE 1 TABLET BY MOUTH ONE TIME PER WEEK FOR 90 DAYS   ? albuterol  (VENTOLIN HFA) 108 (90 Base) MCG/ACT inhaler 2 puff(s)   ? Calcium-Vitamin D-Vitamin K (VIACTIV CALCIUM PLUS D PO) Take by mouth.   ? fexofenadine (ALLEGRA) 180 MG tablet Take 180 mg by mouth daily.   ? levothyroxine (SYNTHROID) 25 MCG tablet Take 25 mcg by mouth 3 (three) times a week.   ? levothyroxine (SYNTHROID, LEVOTHROID) 50 MCG tablet  02/24/2015: Received from: External Pharmacy  ? Magnesium 250 MG TABS Take by mouth.   ? mometasone (NASONEX) 50 MCG/ACT nasal spray 2 sprays by Each Nare route daily.   ? Multiple Vitamins-Minerals (CENTRUM SILVER PO) Take by mouth.   ? zinc gluconate 50 MG tablet Take 50 mg by mouth daily.   ? [DISCONTINUED] Calcium Carbonate (CALCIUM 600 PO) Take by mouth.   ? [DISCONTINUED] citalopram (CELEXA) 10 MG tablet  02/24/2015: Received from: External Pharmacy  ? [DISCONTINUED] mometasone (NASONEX) 50 MCG/ACT nasal spray Place 2 sprays into the nose daily.   ? [DISCONTINUED] mometasone-formoterol (DULERA) 100-5 MCG/ACT AERO Inhale 2 puffs into the lungs 2 (two) times daily.   ? [DISCONTINUED] nitrofurantoin, macrocrystal-monohydrate, (MACROBID) 100 MG capsule Take 1 capsule (100 mg total) by mouth 2 (two) times daily.   ? ?No facility-administered encounter medications on file as of 03/06/2022.  ? ? ?Surgical History: ?Past Surgical History:  ?Procedure Laterality Date  ? ABDOMINAL HYSTERECTOMY    ? APPENDECTOMY    ? BREAST SURGERY    ? CESAREAN SECTION    ? TONSILLECTOMY AND ADENOIDECTOMY    ? VEIN SURGERY    ? ? ?  Medical History: ?Past Medical History:  ?Diagnosis Date  ? Asthma   ? Diabetes mellitus without complication (HCC)   ? Osteoporosis   ? Sleep apnea   ? Thyroid disease   ? ? ?Family History: ?Non contributory to the present illness ? ?Social History: ?Social History  ? ?Socioeconomic History  ? Marital status: Single  ?  Spouse name: Not on file  ? Number of children: Not on file  ? Years of education: Not on file  ? Highest education level: Not on file  ?Occupational History   ? Not on file  ?Tobacco Use  ? Smoking status: Never  ? Smokeless tobacco: Never  ?Vaping Use  ? Vaping Use: Never used  ?Substance and Sexual Activity  ? Alcohol use: Never  ?  Alcohol/week: 0.0 standard drinks  ? Drug use: Never  ? Sexual activity: Not on file  ?Other Topics Concern  ? Not on file  ?Social History Narrative  ? Not on file  ? ?Social Determinants of Health  ? ?Financial Resource Strain: Not on file  ?Food Insecurity: Not on file  ?Transportation Needs: Not on file  ?Physical Activity: Not on file  ?Stress: Not on file  ?Social Connections: Not on file  ?Intimate Partner Violence: Not on file  ? ? ?Vital Signs: ?Blood pressure 120/70, pulse 66, resp. rate 12, height 5\' 3"  (1.6 m), weight 165 lb 9.6 oz (75.1 kg), SpO2 97 %. ?Body mass index is 29.33 kg/m?.  ? ? ?Examination: ?General Appearance: The patient is well-developed, well-nourished, and in no distress. ?Neck Circumference: 34cm ?Skin: Gross inspection of skin unremarkable. ?Head: normocephalic, no gross deformities. ?Eyes: no gross deformities noted. ?ENT: ears appear grossly normal ?Neurologic: Alert and oriented. No involuntary movements. ? ? ? ?EPWORTH SLEEPINESS SCALE: ? ?Scale:  ?(0)= no chance of dozing; (1)= slight chance of dozing; (2)= moderate chance of dozing; (3)= high chance of dozing ? ?Chance  Situtation ?   ?Sitting and reading: 3 ?  ? Watching TV: 2 ?   ?Sitting Inactive in public: 2 ?   ?As a passenger in car: 1   ?   ?Lying down to rest: 3 ?   ?Sitting and talking: 1 ?   ?Sitting quielty after lunch: 3 ?   ?In a car, stopped in traffic: 1 ? ? ?TOTAL SCORE:   16 out of 24 ? ? ? ?SLEEP STUDIES: ? ?PSG (02/2014) AHI 6/hr, REM AHI 25/hr, RERA 20, min SPO2 89% ?Titration (02/2014) APAP@ 5-12 cmH2O due to RERA clusters.  ? ? ?CPAP COMPLIANCE DATA: ? ?Date Range: 4/12/222-03/05/22 ? ?Average Daily Use: 8 hours ? ?Median Use: 8 ? ?Compliance for > 4 Hours: 98% days ? ?AHI: 3.7 respiratory events per hour ? ?Days Used:  363/365 ? ?Mask Leak: machine does not report ? ?95th Percentile Pressure: 11.6 ? ? ? ? ? ? ? ? ?LABS: ?No results found for this or any previous visit (from the past 2160 hour(s)). ? ?Radiology: ?No results found. ? ?No results found. ? ?No results found. ? ? ? ?Assessment and Plan: ?Patient Active Problem List  ? Diagnosis Date Noted  ? Diastolic dysfunction 03/06/2022  ? Obstructive sleep apnea 03/06/2022  ? ? ? ? ?The patient does tolerate PAP and reports benefit from PAP use. The patient was reminded how to adjust mask fit and advised to change supplies regularly. The patient was also counselled on nightly . The compliance is excellent. The AHI is 3.7. The patient's machine is past  end of life and must be replaced. ? ? ?1. Obstructive sleep apnea ?continue excellent compliance. Follow up 30+ days after set up ? ?2. CPAP use counseling ?CPAP couseling-Discussed importance of adequate CPAP use as well as proper care and cleaning techniques of machine and all supplies. ? ?3. Hypothyroidism, unspecified type ?Continue current medication and f/u with PCP. ? ?4. Mild intermittent asthma without complication ?Continue inhaler as needed and as prescribed ? ? ?General Counseling: I have discussed the findings of the evaluation and examination with Kelsey Howard.  I have also discussed any further diagnostic evaluation thatmay be needed or ordered today. Kelsey Howard verbalizes understanding of the findings of todays visit. We also reviewed her medications today and discussed drug interactions and side effects including but not limited excessive drowsiness and altered mental states. We also discussed that there is always a risk not just to her but also people around her. she has been encouraged to call the office with any questions or concerns that should arise related to todays visit. ? ?No orders of the defined types were placed in this encounter. ?  ? ? ? ? ?I have personally obtained a history, examined the patient, evaluated  laboratory and imaging results, formulated the assessment and plan and placed orders. ? ?This patient was seen by Lynn Ito, PA-C in collaboration with Dr. Freda Munro as a part of collaborative care agreement. ?

## 2022-03-06 ENCOUNTER — Ambulatory Visit (INDEPENDENT_AMBULATORY_CARE_PROVIDER_SITE_OTHER): Payer: BC Managed Care – PPO | Admitting: Internal Medicine

## 2022-03-06 VITALS — BP 120/70 | HR 66 | Resp 12 | Ht 63.0 in | Wt 165.6 lb

## 2022-03-06 DIAGNOSIS — E039 Hypothyroidism, unspecified: Secondary | ICD-10-CM

## 2022-03-06 DIAGNOSIS — Z7189 Other specified counseling: Secondary | ICD-10-CM

## 2022-03-06 DIAGNOSIS — G4733 Obstructive sleep apnea (adult) (pediatric): Secondary | ICD-10-CM | POA: Diagnosis not present

## 2022-03-06 DIAGNOSIS — I5189 Other ill-defined heart diseases: Secondary | ICD-10-CM | POA: Insufficient documentation

## 2022-03-06 DIAGNOSIS — J4520 Mild intermittent asthma, uncomplicated: Secondary | ICD-10-CM

## 2022-03-06 NOTE — Patient Instructions (Signed)

## 2023-02-24 NOTE — Progress Notes (Unsigned)
[facility] [place], [place] [address]  Pulmonary Sleep Medicine   Office Visit Note  Patient Name: Kelsey Howard DOB: 1956/04/14 MRN [id number]    Chief Complaint: Obstructive Sleep Apnea visit  Brief History:  Snow is seen today for a follow up visit for APAP@8 -12 cmH2O. The patient has a 9 year history of sleep apnea. Patient is using PAP nightly.  The patient feels not as rested after sleeping with PAP.  The patient reports benefiting from PAP use. Reported sleepiness is not as improved and the Epworth Sleepiness Score is *** out of 24. The patient *** take naps. The patient complains of the following: ***  The compliance download shows 98% compliance with an average use time of 8 hours 32 minutes. The AHI is 5.0.  The patient *** of limb movements disrupting sleep. The patient continues to require PAP therapy in order to eliminate sleep apnea.   ROS  General: (-) fever, (-) chills, (-) night sweat Nose and Sinuses: (-) nasal stuffiness or itchiness, (-) postnasal drip, (-) nosebleeds, (-) sinus trouble. Mouth and Throat: (-) sore throat, (-) hoarseness. Neck: (-) swollen glands, (-) enlarged thyroid, (-) neck pain. Respiratory: *** cough, *** shortness of breath, *** wheezing. Neurologic: *** numbness, *** tingling. Psychiatric: *** anxiety, *** depression   Current Medication: Outpatient Encounter Medications as of 02/25/2023  Medication Sig Note   albuterol (VENTOLIN HFA) 108 (90 Base) MCG/ACT inhaler 2 puff(s)    alendronate (FOSAMAX) 70 MG tablet TAKE 1 TABLET BY MOUTH ONE TIME PER WEEK FOR 90 DAYS    Calcium-Vitamin D-Vitamin K (VIACTIV CALCIUM PLUS D PO) Take by mouth.    fexofenadine (ALLEGRA) 180 MG tablet Take 180 mg by mouth daily.    levothyroxine (SYNTHROID) 25 MCG tablet Take 25 mcg by mouth 3 (three) times a week.    levothyroxine (SYNTHROID, LEVOTHROID) 50 MCG tablet  02/24/2015: Received from: External Pharmacy   Magnesium 250 MG  TABS Take by mouth.    mometasone (NASONEX) 50 MCG/ACT nasal spray 2 sprays by Each Nare route daily.    Multiple Vitamins-Minerals (CENTRUM SILVER PO) Take by mouth.    zinc gluconate 50 MG tablet Take 50 mg by mouth daily.    [DISCONTINUED] Calcium Carbonate (CALCIUM 600 PO) Take by mouth.    [DISCONTINUED] citalopram (CELEXA) 10 MG tablet  02/24/2015: Received from: External Pharmacy   No facility-administered encounter medications on file as of 02/25/2023.    Surgical History: Past Surgical History:  Procedure Laterality Date   ABDOMINAL HYSTERECTOMY     APPENDECTOMY     BREAST SURGERY     CESAREAN SECTION     TONSILLECTOMY AND ADENOIDECTOMY     VEIN SURGERY      Medical History: Past Medical History:  Diagnosis Date   Asthma    Diabetes mellitus without complication ([facility])    Osteoporosis    Sleep apnea    Thyroid disease     Family History: Non contributory to the present illness  Social History: Social History   Socioeconomic History   Marital status: Single    Spouse name: Not on file   Number of children: Not on file   Years of education: Not on file   Highest education level: Not on file  Occupational History   Not on file  Tobacco Use   Smoking status: Never   Smokeless tobacco: Never  Vaping Use   Vaping Use: Never used  Substance and Sexual Activity   Alcohol  use: Never    Alcohol/week: 0.0 standard drinks of alcohol   Drug use: Never   Sexual activity: Not on file  Other Topics Concern   Not on file  Social History Narrative   Not on file   Social Determinants of Health   Financial Resource Strain: Not on file  Food Insecurity: Not on file  Transportation Needs: Not on file  Physical Activity: Not on file  Stress: Not on file  Social Connections: Not on file  Intimate Partner Violence: Not on file    Vital Signs: There were no vitals taken for this visit. There is no height or weight on file to calculate BMI.     Examination: General Appearance: The patient is well-developed, well-nourished, and in no distress. Neck Circumference: 34 cm Skin: Gross inspection of skin unremarkable. Head: normocephalic, no gross deformities. Eyes: no gross deformities noted. ENT: ears appear grossly normal Neurologic: Alert and oriented. No involuntary movements.  STOP BANG RISK ASSESSMENT S (snore) Have you been told that you snore?     YES/NO   T (tired) Are you often tired, fatigued, or sleepy during the day?   YES  O (obstruction) Do you stop breathing, choke, or gasp during sleep? YES/NO   P (pressure) Do you have or are you being treated for high blood pressure? YES/NO   B (BMI) Is your body index greater than 35 kg/m? YES/NO   A (age) Are you 70 years old or older? YES   N (neck) Do you have a neck circumference greater than 16 inches?   NO   G (gender) Are you a female? NO   TOTAL STOP/BANG "YES" ANSWERS        A STOP-Bang score of 2 or less is considered low risk, and a score of 5 or more is high risk for having either moderate or severe OSA. For people who score 3 or 4, doctors may need to perform further assessment to determine how likely they are to have OSA.         EPWORTH SLEEPINESS SCALE:  Scale:  (0)= no chance of dozing; (1)= slight chance of dozing; (2)= moderate chance of dozing; (3)= high chance of dozing  Chance  Situtation    Sitting and reading: ***    Watching TV: ***    Sitting Inactive in public: ***    As a passenger in car: ***      Lying down to rest: ***    Sitting and talking: ***    Sitting quielty after lunch: ***    In a car, stopped in traffic: ***   TOTAL SCORE:   *** out of 24    SLEEP STUDIES:  PSG (02/2014) AHI 6/hr, REM AHI 25/hr, RERA 20, min SpO2 89% Titration (02/2014) APAP@ 5-12 cmH2O due to RERA clusters   CPAP COMPLIANCE DATA:  Date Range: 12/26/2022-02/23/2023  Average Daily Use: 6 hours 41 minutes  Median Use: 6 hours 46  minutes  Compliance for > 4 Hours: 97%  AHI: 0.8 respiratory events per hour  Days Used: 60/60 days  Mask Leak: 5.8  95th Percentile Pressure: 10         LABS: No results found for this or any previous visit (from the past 2160 hour(s)).  Radiology: No results found.  No results found.  No results found.    Assessment and Plan: Patient Active Problem List   Diagnosis Date Noted   Diastolic dysfunction 99991111   Obstructive sleep apnea 03/06/2022  The patient *** tolerate PAP and reports *** benefit from PAP use. The patient was reminded how to *** and advised to ***. The patient was also counselled on ***. The compliance is ***. The AHI is ***.   ***  General Counseling: I have discussed the findings of the evaluation and examination with Kelsey Howard.  I have also discussed any further diagnostic evaluation thatmay be needed or ordered today. Kelsey Howard verbalizes understanding of the findings of todays visit. We also reviewed her medications today and discussed drug interactions and side effects including but not limited excessive drowsiness and altered mental states. We also discussed that there is always a risk not just to her but also people around her. she has been encouraged to call the office with any questions or concerns that should arise related to todays visit.  No orders of the defined types were placed in this encounter.       I have personally obtained a history, examined the patient, evaluated laboratory and imaging results, formulated the assessment and plan and placed orders.  Allyne Gee, MD [facility] Diplomate ABMS Pulmonary Critical Care Medicine and Sleep Medicine

## 2023-02-25 ENCOUNTER — Ambulatory Visit (INDEPENDENT_AMBULATORY_CARE_PROVIDER_SITE_OTHER): Payer: Medicare PPO | Admitting: Internal Medicine

## 2023-02-25 VITALS — BP 127/76 | HR 74 | Resp 18 | Ht 63.0 in | Wt 168.0 lb

## 2023-02-25 DIAGNOSIS — G4733 Obstructive sleep apnea (adult) (pediatric): Secondary | ICD-10-CM | POA: Diagnosis not present

## 2023-02-25 DIAGNOSIS — Z7189 Other specified counseling: Secondary | ICD-10-CM | POA: Diagnosis not present

## 2023-02-25 DIAGNOSIS — J4520 Mild intermittent asthma, uncomplicated: Secondary | ICD-10-CM | POA: Diagnosis not present

## 2023-02-25 DIAGNOSIS — E039 Hypothyroidism, unspecified: Secondary | ICD-10-CM | POA: Diagnosis not present

## 2023-02-25 DIAGNOSIS — I5189 Other ill-defined heart diseases: Secondary | ICD-10-CM

## 2023-02-25 NOTE — Patient Instructions (Signed)

## 2023-10-29 ENCOUNTER — Ambulatory Visit (INDEPENDENT_AMBULATORY_CARE_PROVIDER_SITE_OTHER): Payer: Medicare PPO | Admitting: Internal Medicine

## 2023-10-29 VITALS — BP 137/82 | HR 60 | Resp 16 | Ht 63.0 in | Wt 168.0 lb

## 2023-10-29 DIAGNOSIS — J4520 Mild intermittent asthma, uncomplicated: Secondary | ICD-10-CM

## 2023-10-29 DIAGNOSIS — G4733 Obstructive sleep apnea (adult) (pediatric): Secondary | ICD-10-CM

## 2023-10-29 DIAGNOSIS — Z7189 Other specified counseling: Secondary | ICD-10-CM

## 2023-10-29 DIAGNOSIS — R063 Periodic breathing: Secondary | ICD-10-CM | POA: Diagnosis not present

## 2023-10-29 DIAGNOSIS — E039 Hypothyroidism, unspecified: Secondary | ICD-10-CM

## 2023-10-29 DIAGNOSIS — I5189 Other ill-defined heart diseases: Secondary | ICD-10-CM

## 2023-10-29 NOTE — Progress Notes (Signed)
[facility] [address] [place], [place] [address]  Pulmonary Sleep Medicine   Office Visit Note  Patient Name: Kelsey Howard DOB: [birth year] MRN [id number]    Chief Complaint: Obstructive Sleep Apnea visit  Brief History:  Kelsey Howard is seen today for an annual follow up visit for APAP@ 5-11 cmH2O. The patient has a 9 year history of sleep apnea. Patient is using PAP nightly.  The patient feels rested after sleeping with PAP.  The patient reports benefiting from PAP use. Reported sleepiness is  improved and the Epworth Sleepiness Score is 15 out of 24. The patient will sometimes take naps. The patient complains of the following: patient is complaining of excess fatigue.  The compliance download shows 93% compliance with an average use time of 8 hours 20 minutes. The AHI is 7.6.  The patient does not complain of limb movements disrupting sleep. The patient continues to require PAP therapy in order to eliminate sleep apnea. The patient reports she did go to a cardiologist since last visit after urging from our office as well as her PCP, but states she was told everything was ok. She did not have any updated testing.  ROS  General: (-) fever, (-) chills, (-) night sweat Nose and Sinuses: (-) nasal stuffiness or itchiness, (-) postnasal drip, (-) nosebleeds, (-) sinus trouble. Mouth and Throat: (-) sore throat, (-) hoarseness. Neck: (-) swollen glands, (-) enlarged thyroid, (-) neck pain. Respiratory: - cough, - shortness of breath, - wheezing. Neurologic: - numbness, - tingling. Psychiatric: - anxiety, - depression   Current Medication: Outpatient Encounter Medications as of 10/29/2023  Medication Sig Note   albuterol (VENTOLIN HFA) 108 (90 Base) MCG/ACT inhaler 2 puff(s)    Calcium-Vitamin D-Vitamin K (VIACTIV CALCIUM PLUS D PO) Take by mouth.    fexofenadine (ALLEGRA) 180 MG tablet Take 180 mg by mouth daily.    levothyroxine (SYNTHROID, LEVOTHROID) 50 MCG tablet   02/24/2015: Received from: External Pharmacy   Magnesium 250 MG TABS Take by mouth.    mometasone (NASONEX) 50 MCG/ACT nasal spray 2 sprays by Each Nare route daily.    Multiple Vitamins-Minerals (CENTRUM SILVER PO) Take by mouth.    zinc gluconate 50 MG tablet Take 50 mg by mouth daily.    [DISCONTINUED] alendronate (FOSAMAX) 70 MG tablet TAKE 1 TABLET BY MOUTH ONE TIME PER WEEK FOR 90 DAYS    [DISCONTINUED] Calcium Carbonate (CALCIUM 600 PO) Take by mouth.    [DISCONTINUED] citalopram (CELEXA) 10 MG tablet  02/24/2015: Received from: External Pharmacy   [DISCONTINUED] levothyroxine (SYNTHROID) 25 MCG tablet Take 25 mcg by mouth 3 (three) times a week.    No facility-administered encounter medications on file as of 10/29/2023.    Surgical History: Past Surgical History:  Procedure Laterality Date   ABDOMINAL HYSTERECTOMY     APPENDECTOMY     BREAST SURGERY     CESAREAN SECTION     TONSILLECTOMY AND ADENOIDECTOMY     VEIN SURGERY      Medical History: Past Medical History:  Diagnosis Date   Asthma    Diabetes mellitus without complication ([facility])    Osteoporosis    Sleep apnea    Thyroid disease     Family History: Non contributory to the present illness  Social History: Social History   Socioeconomic History   Marital status: Single    Spouse name: Not on file   Number of children: Not on file   Years of education: Not on file  Highest education level: Not on file  Occupational History   Not on file  Tobacco Use   Smoking status: Never   Smokeless tobacco: Never  Vaping Use   Vaping status: Never Used  Substance and Sexual Activity   Alcohol use: Never    Alcohol/week: 0.0 standard drinks of alcohol   Drug use: Never   Sexual activity: Not on file  Other Topics Concern   Not on file  Social History Narrative   Not on file   Social Determinants of Health   Financial Resource Strain: Not on file  Food Insecurity: Not on file  Transportation Needs: Not on  file  Physical Activity: Not on file  Stress: Not on file  Social Connections: Not on file  Intimate Partner Violence: Not on file    Vital Signs: Blood pressure 137/82, pulse 60, resp. rate 16, height 5\' 3"  (1.6 m), weight 168 lb (76.2 kg), SpO2 96%. Body mass index is 29.76 kg/m.    Examination: General Appearance: The patient is well-developed, well-nourished, and in no distress. Neck Circumference: 34 cm Skin: Gross inspection of skin unremarkable. Head: normocephalic, no gross deformities. Eyes: no gross deformities noted. ENT: ears appear grossly normal Neurologic: Alert and oriented. No involuntary movements.  STOP BANG RISK ASSESSMENT S (snore) Have you been told that you snore?     NO   T (tired) Are you often tired, fatigued, or sleepy during the day?   NO  O (obstruction) Do you stop breathing, choke, or gasp during sleep? NO   P (pressure) Do you have or are you being treated for high blood pressure? NO   B (BMI) Is your body index greater than 35 kg/m? NO   A (age) Are you 66 years old or older? YES   N (neck) Do you have a neck circumference greater than 16 inches?   NO   G (gender) Are you a female? NO   TOTAL STOP/BANG "YES" ANSWERS 1       A STOP-Bang score of 2 or less is considered low risk, and a score of 5 or more is high risk for having either moderate or severe OSA. For people who score 3 or 4, doctors may need to perform further assessment to determine how likely they are to have OSA.         EPWORTH SLEEPINESS SCALE:  Scale:  (0)= no chance of dozing; (1)= slight chance of dozing; (2)= moderate chance of dozing; (3)= high chance of dozing  Chance  Situtation    Sitting and reading: 2    Watching TV: 2    Sitting Inactive in public: 2    As a passenger in car: 2      Lying down to rest: 3    Sitting and talking: 1    Sitting quielty after lunch: 2    In a car, stopped in traffic: 1   TOTAL SCORE:   15 out of 24    SLEEP  STUDIES:  PSG (02/2014) AHI 6/hr, REM AHI 25/hr, RERA 20, min SpO2 89% Titration (02/2014) APAP@ 5-12 cmH2O due to RERA clusters   CPAP COMPLIANCE DATA:  Date Range: 10/28/2022- 10/27/2023  Average Daily Use: 8 hours 20 minutes  Median Use: 8 hours 29 minutes  Compliance for > 4 Hours: 93%  AHI: 7.6 respiratory events per hour  Days Used: 342/365 days  Mask Leak: 16.3  95th Percentile Pressure: 10.1         LABS: No results  found for this or any previous visit (from the past 2160 hour(s)).  Radiology: No results found.  No results found.  No results found.    Assessment and Plan: Patient Active Problem List   Diagnosis Date Noted   Diastolic dysfunction 03/06/2022   Obstructive sleep apnea 03/06/2022      The patient does tolerate PAP and reports benefit from PAP use. The patient was reminded how to adjust mask fit and advised to change supplies regularly. The patient was also counselled on nightly use. The compliance is excellent. The AHI is 7.6 with mainly centrals. Pt also has a high number of cheyne stokes respirations despite pressure adjustments and pt did see cardiology. Will need new titration study to determine best pressure settings and improve control. Patient continues to require PAP to treat their apnea and is medically necessary.   1. Obstructive sleep apnea Will order titration to improve control  2. CPAP use counseling CPAP couseling-Discussed importance of adequate CPAP use as well as proper care and cleaning techniques of machine and all supplies.  3. Central sleep apnea with Cheyne-Stokes respiration Will order titration to try to improve this  4. Mild intermittent asthma without complication Continue inhaler as prescribed  5. Hypothyroidism, unspecified type Continue current medication and f/u with PCP.  6. Diastolic dysfunction Followed by PCP/cardiology   General Counseling: I have discussed the findings of the evaluation  and examination with Bonita Quin.  I have also discussed any further diagnostic evaluation thatmay be needed or ordered today. Kamirah verbalizes understanding of the findings of todays visit. We also reviewed her medications today and discussed drug interactions and side effects including but not limited excessive drowsiness and altered mental states. We also discussed that there is always a risk not just to her but also people around her. she has been encouraged to call the office with any questions or concerns that should arise related to todays visit.  No orders of the defined types were placed in this encounter.       I have personally obtained a history, examined the patient, evaluated laboratory and imaging results, formulated the assessment and plan and placed orders.  This patient was seen by Lynn Ito, PA-C in collaboration with Dr. Freda Munro as a part of collaborative care agreement.  Yevonne Pax, MD [facility] Diplomate ABMS Pulmonary Critical Care Medicine and Sleep Medicine

## 2023-10-29 NOTE — Patient Instructions (Signed)

## 2023-11-15 ENCOUNTER — Encounter (INDEPENDENT_AMBULATORY_CARE_PROVIDER_SITE_OTHER): Payer: Medicare PPO | Admitting: Internal Medicine

## 2023-11-15 DIAGNOSIS — G4733 Obstructive sleep apnea (adult) (pediatric): Secondary | ICD-10-CM | POA: Diagnosis not present

## 2023-11-24 NOTE — Procedures (Signed)
[facility]  Polysomnogram Report Part I  Phone: [phone] Fax: [phone]  Patient Name: Kelsey Howard, Kelsey Howard. Acquisition Number: [id number]  Date of Birth: 11-16-1956 Acquisition Date: 11/15/2023  Referring Physician: Odessa Fleming, MD     History: The patient is a 67 year old  . Medical History: asthma, diabetes mellitus, osteoporosis, OSA and thyroid disease.  Medications: albuterol, Viactiv Calcium plus D, fexofenadine, levothyroxine, magnesium, mometasoneCentrum Silver and zinc gluconate.  Procedure: This routine overnight polysomnogram was performed on the [facility] 5 using the standard CPAP protocol. This included 6 channels of EEG, 2 channels of EOG, chin EMG, bilateral anterior tibialis EMG, nasal/oral thermistor, PTAF (nasal pressure transducer), chest and abdominal wall movements, EKG, and pulse oximetry.  Description: The total recording time was 389.4 minutes. The total sleep time was 314.0 minutes. There were a total of 75.2 minutes of wakefulness after sleep onset for a reducedsleep efficiency of 80.6%. The latency to sleep onset was shortat 0.2 minutes. The R sleep onset latency was within normal limits at 108.0 minutes. Sleep parameters, as a percentage of the total sleep time, demonstrated 13.5% of sleep was in N1 sleep, 33.4% N2, 25.8% N3 and 27.2% R sleep. There were a total of 175 arousals for an arousal index of 33.4 arousals per hour of sleep that was elevated.  Overall, there were a total of 16 respiratory events for a respiratory disturbance index, which includes apneas, hypopneas and RERAs (increased respiratory effort) of 3.1 respiratory events per hour of sleep during the pressure titration.  was initiated at 4 cm H2O at lights out, 11:17 p.m. It was titrated in 1-2 cm increments for intermittent respiratory events to 6 cm H2O. The apnea was controlled at this pressure and supine, REM sleep was observed. The pressure was further titrated to the final pressure of 7  cm H2O.   Additionally, the baseline oxygen saturation during wakefulness was 96%, during NREM sleep averaged 96%, and during REM sleep averaged 96%. The total duration of oxygen < 90% was 0.0 minutes.  Cardiac monitoring-  significant cardiac rhythm irregularities.   Periodic limb movement monitoring- demonstrated that there were 130 periodic limb movements for a periodic limb movement index of 24.8 periodic limb movements per hour of sleep. Frequent, quasi-periodic limb movements were observed during periods of wakefulness.    Impression: This patient's obstructive sleep apnea demonstrated significant improvement with the utilization of nasal  at 6 cm H2O. The patient is currently on APAP 5-11 cm H2O with an elevated AHI on the machine download. This may be due to the frequent periodic limb movements.   There was a significantly elevated periodic limb movement index of 24.8 periodic limb movements per hour of sleep. In addition, frequent, quasi-periodic limb movements were observed during periods of wakefulness. Treatment may be indicated if sleep disruption or sleepiness persists.  Recommendations: Would recommend utilization of nasal  at 6 cm H2O or may remain on 5-11 cm H2O if keeping the current machine.      Evaluation for possible restless leg syndrome is suggested. A ResMed Airfit P10 with XS Nasal pillows  mask, size , was used. Chin strap used during study- . Humidifier used during study- .     Yevonne Pax, MD, [facility] Diplomate ABMS-Pulmonary, Critical Care and Sleep Medicine  Electronically reviewed and digitally signed  [facility] CPAP/BIPAP Polysomnogram Report Part II Phone: [phone] Fax: [phone]  Patient last name Sunderlin Neck Size    in. Acquisition [id number]  Patient first name Kelsey Howard. Weight 168.0 lbs. Started 11/15/2023 at 11:08:28 PM  Birth date 09-03-1956 Height 63.0 in. Stopped 11/16/2023 at 6:43:34 AM  Age 60      Type Adult BMI 29.8  lb/in2 Duration 389.4  Report generated by Otho Perl, RPSGT  Reviewed by: Valentino Hue. Henke, PhD, ABSM, FAASM Sleep Data: Lights Out: 11:17:16 PM Sleep Onset: 11:17:28 PM  Lights On: 5:46:40 AM Sleep Efficiency: 80.6 %  Total Recording Time: 389.4 min Sleep Latency (from Lights Off) 0.2 min  Total Sleep Time (TST): 314.0 min R Latency (from Sleep Onset): 108.0 min  Sleep Period Time: 389.0 min Total number of awakenings: 32  Wake during sleep: 75.0 min Wake After Sleep Onset (WASO): 75.2 min   Sleep Data:         Arousal Summary: Stage  Latency from lights out (min) Latency from sleep onset (min) Duration (min) % Total Sleep Time  Normal values  N 1 0.2 0.0 42.5 13.5 (5%)  N 2 4.2 4.0 105.0 33.4 (50%)  N 3 34.7 34.5 81.0 25.8 (20%)  R 108.2 108.0 85.5 27.2 (25%)   Number Index  Spontaneous 57 10.9  Apneas & Hypopneas 12 2.3  RERAs 0 0.0       (Apneas & Hypopneas & RERAs)  (12) (2.3)  Limb Movement 106 20.3  Snore 0 0.0  TOTAL 175 33.4     Respiratory Data:  CA OA MA Apnea Hypopnea* A+ H RERA Total  Number 0 0 0 0 16 16 0 16  Mean Dur (sec) 0.0 0.0 0.0 0.0 22.9 22.9 0.0 22.9  Max Dur (sec) 0.0 0.0 0.0 0.0 29.5 29.5 0.0 29.5  Total Dur (min) 0.0 0.0 0.0 0.0 6.1 6.1 0.0 6.1  % of TST 0.0 0.0 0.0 0.0 1.9 1.9 0.0 1.9  Index (#/h TST) 0.0 0.0 0.0 0.0 3.1 3.1 0.0 3.1  *Hypopneas scored based on 4% or greater desaturation.  Sleep Stage:         REM NREM TST  AHI 0.7 3.9 3.1  RDI 0.7 3.9 3.1   Sleep (min) TST (%) REM (min) NREM (min) CA (#) OA (#) MA (#) HYP (#) AHI (#/h) RERA (#) RDI (#/h) Desat (#)  Supine 214.4 68.28 46.5 167.9 0 0 0 15 4.2 0 4.2 17  Non-Supine 99.60 31.72 39.00 60.60 0.00 0.00 0.00 1.00 0.60 0 0.60 1.00  Left: 99.6 31.72 39.0 60.6 0 0 0 1 0.6 0 0.6 1     Snoring: Total number of snoring episodes  0  Total time with snoring    min (   % of sleep)   Oximetry Distribution:             WK REM NREM TOTAL  Average (%)   96 96 96 96  < 90%  0.0 0.0 0.0 0.0  < 80% 0.0 0.0 0.0 0.0  < 70% 0.0 0.0 0.0 0.0  # of Desaturations* 2 0 2 4  Desat Index (#/hour) 1.7 0.0 0.5 0.8  Desat Max (%) 7 0 4 7  Desat Max Dur (sec) 45.0 0.0 25.0 45.0  Approx Min O2 during sleep 92  Approx min O2 during a respiratory event 93  Was Oxygen added (Y/N) and final rate :    LPM  *Desaturations based on 4% or greater drop from baseline.   Cheyne Stokes Breathing: None Present    Heart Rate Summary:  Average Heart Rate During Sleep 60.9 bpm  Highest Heart Rate During Sleep (95th %) 70.0 bpm      Highest Heart Rate During Sleep 178 bpm (artifact)  Highest Heart Rate During Recording (TIB) 235 bpm (artifact)   Heart Rate Observations: Event Type # Events   Bradycardia 0 Lowest HR Scored: N/A  Sinus Tachycardia During Sleep 0 Highest HR Scored: N/A  Narrow Complex Tachycardia 0 Highest HR Scored: N/A  Wide Complex Tachycardia 0 Highest HR Scored: N/A  Asystole 0 Longest Pause: N/A  Atrial Fibrillation 0 Duration Longest Event: N/A  Other Arrythmias   Type:   Periodic Limb Movement Data: (Primary legs unless otherwise noted) Total # Limb Movement 197 Limb Movement Index 37.6  Total # PLMS 130 PLMS Index 24.8  Total # PLMS Arousals 64 PLMS Arousal Index 12.2  Percentage Sleep Time with PLMS 64.41min (20.6 % sleep)  Mean Duration limb movements (secs) 276.6    IPAP Level (cmH2O) EPAP Level (cmH2O) Total Duration (min) Sleep Duration (min) Sleep (%) REM (%) CA  #) OA # MA # HYP #) AHI (#/hr) RERAs # RERAs (#/hr) RDI (#/hr)  4 4 121.7 106.7 87.7 11.3 0 0 0 15 8.4 0 0.0 8.4  6 6  243.5 190.8 78.4 26.1 0 0 0 1 0.3 0 0.0 0.3  7 7  17.4 15.9 91.4 45.4 0 0 0 0 0.0 0 0.0 0.0

## 2024-02-23 NOTE — Progress Notes (Signed)
 [facility] [address] [place], [place] [address]  Pulmonary Sleep Medicine   Office Visit Note  Patient Name: Kelsey Howard DOB: 1956/06/18 MRN [id number]    Chief Complaint: Obstructive Sleep Apnea visit  Brief History:  Kelsey Howard is seen today for a follow up visit for CPAP@ 6 cmH2O. The patient has a 10 year history of sleep apnea. Patient is using PAP nightly.  The patient feels rested after sleeping with PAP.  The patient reports benefiting from PAP use. Reported sleepiness is  improved and the Epworth Sleepiness Score is 14 out of 24. The patient will occasionally take naps. The patient complains of the following: pt has been experiencing more fatigue than usual.  The compliance download shows 100% compliance with an average use time of 8 hours 40 minutes. The AHI is 8.8.  The patient does not complain of limb movements disrupting sleep. The patient continues to require PAP therapy in order to eliminate sleep apnea. Despite several pressure changes pt continues to have apnea on download. Last titration showed pt well controlled in lab on CPAP @6cm  h2O, however AHI still elevated and is almost entirely centrals now with cheyne stokes. Pt reports cardiology told her she was fine and is to follow up as needed. Discussed needing to update baseline testing to evaluate apnea off of machine.  ROS  General: (-) fever, (-) chills, (-) night sweat Nose and Sinuses: (-) nasal stuffiness or itchiness, (-) postnasal drip, (-) nosebleeds, (-) sinus trouble. Mouth and Throat: (-) sore throat, (-) hoarseness. Neck: (-) swollen glands, (-) enlarged thyroid, (-) neck pain. Respiratory: - cough, - shortness of breath, - wheezing. Neurologic: - numbness, - tingling. Psychiatric: - anxiety, - depression   Current Medication: Outpatient Encounter Medications as of 02/24/2024  Medication Sig Note   levocetirizine (XYZAL) 5 MG tablet     albuterol (VENTOLIN HFA) 108 (90 Base) MCG/ACT  inhaler 2 puff(s)    Calcium-Vitamin D-Vitamin K (VIACTIV CALCIUM PLUS D PO) Take by mouth.    levothyroxine (SYNTHROID, LEVOTHROID) 50 MCG tablet  02/24/2015: Received from: External Pharmacy   Magnesium 250 MG TABS Take by mouth.    mometasone (NASONEX) 50 MCG/ACT nasal spray 2 sprays by Each Nare route daily.    Multiple Vitamins-Minerals (CENTRUM SILVER PO) Take by mouth.    zinc gluconate 50 MG tablet Take 50 mg by mouth daily.    [DISCONTINUED] Calcium Carbonate (CALCIUM 600 PO) Take by mouth.    [DISCONTINUED] citalopram (CELEXA) 10 MG tablet  02/24/2015: Received from: External Pharmacy   [DISCONTINUED] fexofenadine (ALLEGRA) 180 MG tablet Take 180 mg by mouth daily.    No facility-administered encounter medications on file as of 02/24/2024.    Surgical History: Past Surgical History:  Procedure Laterality Date   ABDOMINAL HYSTERECTOMY     APPENDECTOMY     BREAST SURGERY     CESAREAN SECTION     TONSILLECTOMY AND ADENOIDECTOMY     VEIN SURGERY      Medical History: Past Medical History:  Diagnosis Date   Asthma    Diabetes mellitus without complication (HCC)    Osteoporosis    Sleep apnea    Thyroid disease     Family History: Non contributory to the present illness  Social History: Social History   Socioeconomic History   Marital status: Single    Spouse name: Not on file   Number of children: Not on file   Years of education: Not on file   Highest education  level: Not on file  Occupational History   Not on file  Tobacco Use   Smoking status: Never   Smokeless tobacco: Never  Vaping Use   Vaping status: Never Used  Substance and Sexual Activity   Alcohol use: Never    Alcohol/week: 0.0 standard drinks of alcohol   Drug use: Never   Sexual activity: Not on file  Other Topics Concern   Not on file  Social History Narrative   Not on file   Social Drivers of Health   Financial Resource Strain: Not on file  Food Insecurity: Not on file  Transportation  Needs: Not on file  Physical Activity: Not on file  Stress: Not on file  Social Connections: Not on file  Intimate Partner Violence: Not on file    Vital Signs: Blood pressure 123/78, pulse 70, resp. rate 16, height 5\' 4"  (1.626 m), weight 173 lb (78.5 kg), SpO2 96%. Body mass index is 29.7 kg/m.    Examination: General Appearance: The patient is well-developed, well-nourished, and in no distress. Neck Circumference: 34 cm Skin: Gross inspection of skin unremarkable. Head: normocephalic, no gross deformities. Eyes: no gross deformities noted. ENT: ears appear grossly normal Neurologic: Alert and oriented. No involuntary movements.  STOP BANG RISK ASSESSMENT S (snore) Have you been told that you snore?     NO   T (tired) Are you often tired, fatigued, or sleepy during the day?   YES  O (obstruction) Do you stop breathing, choke, or gasp during sleep? NO   P (pressure) Do you have or are you being treated for high blood pressure? YES   B (BMI) Is your body index greater than 35 kg/m? NO   A (age) Are you 67 years old or older? YES   N (neck) Do you have a neck circumference greater than 16 inches?   NO   G (gender) Are you a female? NO   TOTAL STOP/BANG "YES" ANSWERS 3       A STOP-Bang score of 2 or less is considered low risk, and a score of 5 or more is high risk for having either moderate or severe OSA. For people who score 3 or 4, doctors may need to perform further assessment to determine how likely they are to have OSA.         EPWORTH SLEEPINESS SCALE:  Scale:  (0)= no chance of dozing; (1)= slight chance of dozing; (2)= moderate chance of dozing; (3)= high chance of dozing  Chance  Situtation    Sitting and reading: 2    Watching TV: 2    Sitting Inactive in public: 1    As a passenger in car: 2      Lying down to rest: 3    Sitting and talking: 1    Sitting quielty after lunch: 2    In a car, stopped in traffic: 1   TOTAL SCORE:   14 out of  24    SLEEP STUDIES:  PSG - 02/2014 - AHI 6/hr, REM AHI 25/hr, RERA 20/hr, min Sp02 89% Titraiton - 02/2014 - APAP @ 5-12cmH20 due to RERA clusters Titration - 11/15/2023 - CPAP  @ CPAP @ 6 or remain at 5-11cmH20.  EVAL for PLM suggested 24.8/hr   CPAP COMPLIANCE DATA:  Date Range: 12/10/2023-02/21/2024  Average Daily Use: 8 hours 40 minutes  Median Use: 8 hours 43 minutes  Compliance for > 4 Hours: 100%  AHI: 8.8 respiratory events per hour  Days Used: 74/74  days  Mask Leak: 5.4  95th Percentile Pressure: 6         LABS: No results found for this or any previous visit (from the past 2160 hours).  Radiology: No results found.  No results found.  No results found.    Assessment and Plan: Patient Active Problem List   Diagnosis Date Noted   Diastolic dysfunction 03/06/2022   Obstructive sleep apnea 03/06/2022      The patient does tolerate PAP and reports some benefit from PAP use. The patient was reminded how to adjust mask fit and advised to change supplies regularly. The patient was also counselled on nightly use. The compliance is excellent. The AHI is 8.8 and is mainly centrals with cheyne stokes seen. Will need to update PSG to re-evaluate apnea off of machine as apnea appears worse on machine recently. If significant apnea still seen then may need bipap titration.  1. 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 CPAP use counseling CPAP couseling-Discussed importance of adequate CPAP use as well as proper care and cleaning techniques of machine and all supplies.  3. Mild intermittent asthma without complication Continue inhaler as prescribed  4. Hypothyroidism, unspecified type Continue current medication and f/u with PCP.  5. Diastolic dysfunction Followed by PCP   General Counseling: I have discussed the findings of the evaluation and examination with Bonita Quin.  I have also  discussed any further diagnostic evaluation thatmay be needed or ordered today. Shauntelle verbalizes understanding of the findings of todays visit. We also reviewed her medications today and discussed drug interactions and side effects including but not limited excessive drowsiness and altered mental states. We also discussed that there is always a risk not just to her but also people around her. she has been encouraged to call the office with any questions or concerns that should arise related to todays visit.  No orders of the defined types were placed in this encounter.       I have personally obtained a history, examined the patient, evaluated laboratory and imaging results, formulated the assessment and plan and placed orders.  This patient was seen by Lynn Ito, PA-C in collaboration with Dr. Freda Munro as a part of collaborative care agreement.  Yevonne Pax, MD [facility] Diplomate ABMS Pulmonary Critical Care Medicine and Sleep Medicine

## 2024-02-24 ENCOUNTER — Ambulatory Visit (INDEPENDENT_AMBULATORY_CARE_PROVIDER_SITE_OTHER): Admitting: Internal Medicine

## 2024-02-24 VITALS — BP 123/78 | HR 70 | Resp 16 | Ht 64.0 in | Wt 173.0 lb

## 2024-02-24 DIAGNOSIS — J4520 Mild intermittent asthma, uncomplicated: Secondary | ICD-10-CM

## 2024-02-24 DIAGNOSIS — Z7189 Other specified counseling: Secondary | ICD-10-CM | POA: Diagnosis not present

## 2024-02-24 DIAGNOSIS — R063 Periodic breathing: Secondary | ICD-10-CM | POA: Diagnosis not present

## 2024-02-24 DIAGNOSIS — I5189 Other ill-defined heart diseases: Secondary | ICD-10-CM

## 2024-02-24 DIAGNOSIS — E039 Hypothyroidism, unspecified: Secondary | ICD-10-CM | POA: Diagnosis not present

## 2024-02-24 NOTE — Patient Instructions (Signed)

## 2024-04-02 ENCOUNTER — Encounter (INDEPENDENT_AMBULATORY_CARE_PROVIDER_SITE_OTHER): Admitting: Internal Medicine

## 2024-04-02 DIAGNOSIS — G4733 Obstructive sleep apnea (adult) (pediatric): Secondary | ICD-10-CM | POA: Diagnosis not present

## 2024-04-14 NOTE — Procedures (Signed)
 [facility]  Polysomnogram Report Part I                                                               Phone: [phone] Fax: [phone]  Patient Name: Kelsey Howard, Kelsey Howard. Acquisition Number: [id number]  Date of Birth: Dec 06, 1955 Acquisition Date: 04/02/2024  Referring Physician: Martell Skinner, MD     History: The patient is a 68 year old  who was referred for re-evaluation of obstructive sleep apnea. Medical History: asthma, diabetes mellitus, osteoporosis, sleep apnea, hypothyroidism, excessive daytime sleepiness.  Medications: Xyzal, Ventolin HFA, calcium - vitamin D, vitamin K, Synthroid, magnesium, centrum silver, zinc, levothyroxine, fish oil, Furate nasal spray.  Procedure: This routine overnight polysomnogram was performed on the Alice 5 using the standard diagnostic protocol. This included 6 channels of EEG, 2 channels of EOG, chin EMG, bilateral anterior tibialis EMG, nasal/oral thermistor, PTAF (nasal pressure transducer), chest and abdominal wall movements, EKG, and pulse oximetry.  Description: The total recording time was 445.5 minutes. The total sleep time was 368.5 minutes. There were a total of 67.0 minutes of wakefulness after sleep onset for a reducedsleep efficiency of 82.7%. The latency to sleep onset was within normal limitsat 10.0 minutes. The R sleep onset latency was prolonged at 216.0 minutes. Sleep parameters, as a percentage of the total sleep time, demonstrated 17.0% of sleep was in N1 sleep, 55.2% N2, 6.9% N3 and 20.9% R sleep. There were a total of 44 arousals for an arousal index of 7.2 arousals per hour of sleep that was normal.  Respiratory monitoring demonstrated   snoring . There were 79 apneas and hypopneas for an Apnea Hypopnea Index of 12.9 apneas and hypopneas per hour of sleep. All of the respiratory events were central apneas and hypopneas with a Cheyne-Stokes respiratory pattern. The REM related apnea hypopnea index was 2.3/hr. of REM sleep  compared to a NREM AHI of 15.6/hr.  The average duration of the respiratory events was 17.1 seconds with a maximum duration of 27.0 seconds. The respiratory events occurred almost exclusively in the supine position with an AHI of 27.4. The respiratory events were associated with peripheral oxygen desaturations on the average to 92%. The lowest oxygen desaturation associated with a respiratory event was 89%. Additionally, the baseline oxygen saturation during wakefulness was 95%, during NREM sleep averaged 95%, and during REM sleep averaged 97%. The total duration of oxygen < 90% was 0.3 minutes.  Cardiac monitoring-  demonstrate transient cardiac decelerations associated with the apneas.  significant cardiac rhythm irregularities.   Periodic limb movement monitoring- demonstrated that there were 115 periodic limb movements for a periodic limb movement index of 18.7 periodic limb movements per hour of sleep.   Impression: This routine overnight polysomnogram demonstrated significant central sleep apnea with an overall Apnea Hypopnea Index of 12.9 apneas and hypopneas per hour of sleep. The respiratory events occurred almost exclusively in the supine position with an AHI of 27.4. All of the respiratory events were central sleep apneas and hypopneas with a Cheyne-Stokes respiratory pattern. The lowest desaturation was to 89%.    There was a significantly elevated periodic limb movement index of 18.7 periodic limb movements per hour of sleep. Sometimes these limb movements subside once the apnea is controlled.  reduced sleep  efficiency withincreased awakenings and a reduced percentage of slow wave sleep. These findings would appear to be due to the combination of obstructive sleep apnea and periodic limb movements.   Recommendations:    A BiPAP titration would be recommended due to the presence of central sleep apnea. Additionally, would recommend weight loss in a patient with a BMI of 29.8.      Cordie Deters, MD, [facility] Diplomate ABMS-Pulmonary, Critical Care and Sleep Medicine  Electronically reviewed and digitally signed  [facility] Polysomnogram Report Part II  Phone: [phone] Fax: [phone]  Patient last name Morsch Neck Size 13.0 in. Acquisition [phone]  Patient first name Kelsey Howard. Weight 168.0 lbs. Started 04/02/2024 at 10:35:21 PM  Birth date 1956-05-29 Height 63.0 in. Stopped 04/03/2024 at 6:14:15 AM  Age 68 BMI 29.8 lb/in2 Duration 445.5  Study Type Adult      Auburn Blaze, RPSGT & Ja'Net Carlon Chester   Reviewed by: Kathe G. Henke, PhD, ABSM, FAASM Sleep Data: Lights Out: 10:44:21 PM Sleep Onset: 10:54:21 PM  Lights On: 6:09:51 AM Sleep Efficiency: 83.8 %  Total Recording Time: 445.5 min Sleep Latency (from Lights Off) 10.0 min  Total Sleep Time (TST): 373.5 min R Latency (from Sleep Onset): 216.0 min  Sleep Period Time: 434.5 min Total number of awakenings: 26  Wake during sleep: 61.0 min Wake After Sleep Onset (WASO): 62.0 min   Sleep Data:         Arousal Summary: Stage  Latency from lights out (min) Latency from sleep onset (min) Duration (min) % Total Sleep Time  Normal values  N 1 10.0 0.0 62.5 16.7 (5%)  N 2 12.0 2.0 208.5 55.8 (50%)  N 3 118.5 108.5 25.5 6.8 (20%)  R 226.0 216.0 77.0 20.6 (25%)   Number Index  Spontaneous 16 2.6  Apneas & Hypopneas 6 1.0  RERAs 0 0.0       (Apneas & Hypopneas & RERAs)  (6) (1.0)  Limb Movement 22 3.5  Snore 0 0.0  TOTAL 44 7.1     Respiratory Data:  CA OA MA Apnea C Hypopnea* A+ H RERA Total  Number 57 0 0 57 19 76 0 76  Mean Dur (sec) 17.1 0.0 0.0 17.1 18.2 17.4 0.0 17.4  Max Dur (sec) 27.0 0.0 0.0 27.0 26.0 27.0 0.0 27.0  Total Dur (min) 16.2 0.0 0.0 16.2 5.8 22.0 0.0 22.0  % of TST 4.3 0.0 0.0 4.3 1.5 5.9 0.0 5.9  Index (#/h TST) 9.2 0.0 0.0 9.2 3.1 12.2 0.0 12.2  *Hypopneas scored based on 4% or greater desaturation.  Sleep Stage:        REM NREM TST  AHI 0.0 15.4 12.2  RDI 0.0  15.4 12.2          Body Position Data:  Sleep (min) TST (%) REM (min) NREM (min) CA (#) OA (#) MA (#) C HYP (#) AHI (#/h) RERA (#) RDI (#/h) Desat (#)  Supine 164.5 44.04 0.0 164.5 57 0 0 18 27.4 0 27.4 70  Non-Supine 209.00 55.96 77.00 132.00 0.00 0.00 0.00 1.00 0.29 0 0.29 8.00  Left: 77.0 20.62 0.0 77.0 0 0 0 1 0.8 0 0.8 6  Right: 132.0 35.34 77.0 55.0 0 0 0 0 0.0 0 0.00 2    Snoring: Total number of snoring episodes  0  Total time with snoring    min (   % of sleep)   Oximetry Distribution:  WK REM NREM TOTAL  Average (%)   95 97 95 96  < 90% 0.2 0.0 0.1 0.3  < 80% 0.0 0.0 0.0 0.0  < 70% 0.0 0.0 0.0 0.0  # of Desaturations* 8 0 71 79  Desat Index (#/hour) 7.3 0.0 14.4 12.7  Desat Max (%) 7 0 8 8  Desat Max Dur (sec) 38.0 0.0 86.0 86.0  Approx Min O2 during sleep 89  Approx min O2 during a respiratory event 89  Was Oxygen added (Y/N) and final rate :    LPM  *Desaturations based on 4% or greater drop from baseline.  Cheyne Stokes Breathing  WK REM NREM  Number 0 0 7  Longest Event (sec.)           Total Duration       2164.0    Hypoventilation: None Present   Heart Rate Summary:  Average Heart Rate During Sleep 58.7 bpm      Highest Heart Rate During Sleep (95th %) 69.0 bpm      Highest Heart Rate During Sleep 194 bpm (artifact)  Highest Heart Rate During Recording (TIB) 244 bpm (artifact)   Heart Rate Observations: Event Type # Events   Bradycardia 0 Lowest HR Scored: N/A  Sinus Tachycardia During Sleep 0 Highest HR Scored: N/A  Narrow Complex Tachycardia 0 Highest HR Scored: N/A  Wide Complex Tachycardia 0 Highest HR Scored: N/A  Asystole 0 Longest Pause: N/A  Atrial Fibrillation 0 Duration Longest Event: N/A  Other Arrythmias   Type:      Periodic Limb Movement Data: (Primary legs unless otherwise noted) Total # Limb Movement 143 Limb Movement Index 23.0  Total # PLMS 113 PLMS Index 18.2  Total # PLMS Arousals 14 PLMS  Arousal Index 2.2  Percentage Sleep Time with PLMS 71.59min (19.1 % sleep)  Mean Duration limb movements (secs) 286.1

## 2024-04-22 ENCOUNTER — Encounter (INDEPENDENT_AMBULATORY_CARE_PROVIDER_SITE_OTHER): Payer: Self-pay | Admitting: Internal Medicine

## 2024-04-22 DIAGNOSIS — G4733 Obstructive sleep apnea (adult) (pediatric): Secondary | ICD-10-CM | POA: Diagnosis not present

## 2024-05-03 NOTE — Procedures (Signed)
 [facility]  Polysomnogram Report Part I  Phone: [phone] Fax: [phone]  Patient Name: Kelsey Howard, Strauss Acquisition Number: [id number]  Date of Birth: [birth year] Acquisition Date: 04/22/2024  Referring Physician: Lionel Riddle, MD     History: The patient is a 68 year old  . Medical History: asthma, diabetes mellitus, osteoporosis, sleep apnea, hypothyroidism, excessive daytime sleepiness.  Medications: Xyzal, Ventolin HFA, calcium - vitamin D, vitamin K, Synthroid, magnesium, Centrum Silver, zinc, fish oil.  Procedure: This routine overnight polysomnogram was performed on the Alice 5 using the standard BIPAP protocol. This included 6 channels of EEG, 2 channels of EOG, chin EMG, bilateral anterior tibialis EMG, nasal/oral thermistor, PTAF (nasal pressure transducer), chest and abdominal wall movements, EKG, and pulse oximetry.  Description: The total recording time was 375.5 minutes. The total sleep time was 261.0 minutes. There were a total of 108.5 minutes of wakefulness after sleep onset for a reducedsleep efficiency of 69.5%. The latency to sleep onset was shortat 6.0 minutes. The R sleep onset latency was prolonged at 257.0 minutes. Sleep parameters, as a percentage of the total sleep time, demonstrated 21.5% of sleep was in N1 sleep, 36.0% N2, 33.9% N3 and 8.6% R sleep. There were a total of 83 arousals for an arousal index of 19.1 arousals per hour of sleep that was elevated.  Overall, there were a total of 89 respiratory events for a respiratory disturbance index, which includes apneas, hypopneas and RERAs (increased respiratory effort) of 20.5 respiratory events per hour of sleep during the pressure titration. BiPAP was initiated at 8/4 cm H2O at lights out, 11:39 p.m. It was titrated to 12/6 cm H2O and a backup rate of 14 was added for central apneas. After a few transitional events the centrals were controlled at the final settings. Cheyne-Stokes respiration was observed.    Additionally, the baseline oxygen saturation during wakefulness was 97%, during NREM sleep averaged 96%, and during REM sleep averaged 95%. The total duration of oxygen < 90% was 0.1 minutes.  Cardiac monitoring-  significant cardiac rhythm irregularities.   Periodic limb movement monitoring- demonstrated that there were 123 periodic limb movements for a periodic limb movement index of 28.3 periodic limb movements per hour of sleep. Frequent, quasi-periodic limb movements were observed during periods of wakefulness.   Impression: This patient's obstructive sleep apnea demonstrated significant improvement with the utilization of nasal BiPAP ST.   There was a significantly elevated periodic limb movement index of 28.3 periodic limb movements per hour of sleep. In addition, frequent, quasi-periodic limb movements were observed during periods of wakefulness. These limb movements were also observed during the prior PSG. Treatment may be indicated if sleep disruption or sleepiness persist once the patient is fully compliant with PAP.  Recommendations: Would recommend utilization of nasal BiPAP ST at 12/6 cm H2O with backup rate of 14.   An Airfit P10  mask, size small, was used. Chin strap used during study- no. Humidifier used during study- yes.     Cordie Deters, MD, [facility] Diplomate ABMS-Pulmonary, Critical Care and Sleep Medicine  Electronically reviewed and digitally signed  [facility] CPAP/BIPAP Polysomnogram Report Part II Phone: [phone] Fax: [phone]  Patient last name Howard Neck Size 13.0 in. Acquisition [phone]  Patient first name Kelsey Weight 168.0 lbs. Started 04/22/2024 at 11:29:14 PM  Birth date 1956-03-28 Height 63.0 in. Stopped 04/23/2024 at 5:58:26 AM  Age 68      Type Adult BMI 29.8 lb/in2 Duration 375.5  Theodis Fiscal  Vaughn Georges, RPSGT & Ja'Net Carlon Chester   Reviewed by: Kathe G. Henke, PhD, ABSM, FAASM Sleep Data: Lights Out: 11:39:14 PM Sleep Onset: 11:45:14  PM  Lights On: 5:54:44 AM Sleep Efficiency: 69.5 %  Total Recording Time: 375.5 min Sleep Latency (from Lights Off) 6.0 min  Total Sleep Time (TST): 261.0 min R Latency (from Sleep Onset): 257.0 min  Sleep Period Time: 369.5 min Total number of awakenings: 39  Wake during sleep: 108.5 min Wake After Sleep Onset (WASO): 108.5 min   Sleep Data:         Arousal Summary: Stage  Latency from lights out (min) Latency from sleep onset (min) Duration (min) % Total Sleep Time  Normal values  N 1 6.0 0.0 56.0 21.5 (5%)  N 2 7.0 1.0 94.0 36.0 (50%)  N 3 80.0 74.0 88.5 33.9 (20%)  R 263.0 257.0 22.5 8.6 (25%)   Number Index  Spontaneous 14 3.2  Apneas & Hypopneas 7 1.6  RERAs 0 0.0       (Apneas & Hypopneas & RERAs)  (7) (1.6)  Limb Movement 64 14.7  Snore 0 0.0  TOTAL 85 19.5     Respiratory Data:  CA OA MA Apnea Hypopnea* A+ H RERA Total  Number 70 1 0 71 6 77 0 77  Mean Dur (sec) 15.6 11.5 0.0 15.6 16.0 15.6 0.0 15.6  Max Dur (sec) 28.0 11.5 0.0 28.0 18.5 28.0 0.0 28.0  Total Dur (min) 18.2 0.2 0.0 18.4 1.6 20.0 0.0 20.0  % of TST 7.0 0.1 0.0 7.1 0.6 7.7 0.0 7.7  Index (#/h TST) 16.1 0.2 0.0 16.3 1.4 17.7 0.0 17.7  *Hypopneas scored based on 4% or greater desaturation.  Sleep Stage:         REM NREM TST  AHI 0.0 19.4 17.7  RDI 0.0 19.4 17.7   Sleep (min) TST (%) REM (min) NREM (min) CA (#) OA (#) MA (#) HYP (#) AHI (#/h) RERA (#) RDI (#/h) Desat (#)  Supine 134.6 51.57 22.5 112.1 11 1  0 0 5.3 0 5.3 33  Non-Supine 126.40 48.43 0.00 126.40 59.00 0.00 0.00 6.00 30.85 0 30.85 68.00  Left: 59.5 22.80 0.0 59.5 34 0 0 6 40.3 0 40.3 39  Right: 66.9 25.63 0.0 66.9 25 0 0 0 22.4 0 22.4 29  UP: 0.0 0.00 0.0 0.0 0 0 0 0 0.0 0 0.00 0    Snoring: Total number of snoring episodes  0  Total time with snoring    min (   % of sleep)  Oximetry Distribution:             WK REM NREM TOTAL  Average (%)   97 95 96 96  < 90% 0.0 0.0 0.1 0.1  < 80% 0.0 0.0 0.0 0.0  < 70% 0.0 0.0  0.0 0.0  # of Desaturations* 8 0 93 101  Desat Index (#/hour) 4.6 0.0 23.4 23.2  Desat Max (%) 12 0 8 12  Desat Max Dur (sec) 103.0 0.0 99.0 103.0  Approx Min O2 during sleep 88  Approx min O2 during a respiratory event 92  Was Oxygen added (Y/N) and final rate :    LPM  *Desaturations based on 3% or greater drop from baseline. Cheyne Stokes Breathing  WK REM NREM  Number 0 0 3  Longest Event (sec.)           Total Duration       1377.5   Heart Rate Summary:  Average Heart Rate During Sleep 62.8 bpm      Highest Heart Rate During Sleep (95th %) 72.0 bpm      Highest Heart Rate During Sleep 217 bpm (artifact)  Highest Heart Rate During Recording (TIB) 217 bpm (artifact)   Heart Rate Observations: Event Type # Events   Bradycardia 0 Lowest HR Scored: N/A  Sinus Tachycardia During Sleep 0 Highest HR Scored: N/A  Narrow Complex Tachycardia 0 Highest HR Scored: N/A  Wide Complex Tachycardia 0 Highest HR Scored: N/A  Asystole 0 Longest Pause: N/A  Atrial Fibrillation 0 Duration Longest Event: N/A  Other Arrythmias   Type:   Periodic Limb Movement Data: (Primary legs unless otherwise noted) Total # Limb Movement 179 Limb Movement Index 41.1  Total # PLMS 126 PLMS Index 29.0  Total # PLMS Arousals 48 PLMS Arousal Index 11.0  Percentage Sleep Time with PLMS 73.6min (28.2 % sleep)  Mean Duration limb movements (secs) 441.4    IPAP Level (cmH2O) EPAP Level (cmH2O) Total Duration (min) Sleep Duration (min) Sleep (%) REM (%) CA  #) OA # MA # HYP #) AHI (#/hr) RERAs # RERAs (#/hr) RDI (#/hr)  8 4 74.2 46.5 62.7 0.0 25 1 0 0 33.5 0 0.0 33.5  10 4  15.7 15.4 98.1 0.0 13 0 0 6 74.0 0 0.0 74.0  11 5 12.2 5.0 41.0 0.0 4 0 0 0 48.0 0 0.0 48.0  12 6 257.5 194.0 75.3 8.7 28 0 0 0 8.7 0 0.0 8.7

## 2024-07-30 NOTE — Progress Notes (Signed)
 [facility] [address] [place], [place] [address]  Pulmonary Sleep Medicine   Office Visit Note  Patient Name: Kelsey Howard DOB: 1956-03-10 MRN [id number]    Chief Complaint: Obstructive Sleep Apnea visit  Brief History:  Kelsey Howard is seen today for a follow up visit for BiPAP ST@ 12/6 cmH2O, RR 14 bpm. The patient has a 10 year history of sleep apnea. Patient is using PAP nightly.  The patient feels rested after sleeping with PAP.  The patient reports benefiting from PAP use. Reported sleepiness is  improved and the Epworth Sleepiness Score is 13 out of 24. The patient does not take naps. The patient complains of the following: none.  The compliance download shows 98% compliance with an average use time of 8 hours 4 minutes. The AHI is 1.5.  The patient does not complain of limb movements disrupting sleep. The patient continues to require PAP therapy in order to eliminate sleep apnea. The patient continues to have daytime sleepiness, she relates this goes back to childhood, and got in trouble in school because she fell asleep.   ROS  General: (-) fever, (-) chills, (-) night sweat Nose and Sinuses: (-) nasal stuffiness or itchiness, (-) postnasal drip, (-) nosebleeds, (-) sinus trouble. Mouth and Throat: (-) sore throat, (-) hoarseness. Neck: (-) swollen glands, (-) enlarged thyroid, (-) neck pain. Respiratory: - cough, - shortness of breath, - wheezing. Neurologic: - numbness, - tingling. Psychiatric: - anxiety, - depression   Current Medication: Outpatient Encounter Medications as of 08/02/2024  Medication Sig Note   albuterol (VENTOLIN HFA) 108 (90 Base) MCG/ACT inhaler 2 puff(s)    Calcium-Vitamin D-Vitamin K (VIACTIV CALCIUM PLUS D PO) Take by mouth.    levocetirizine (XYZAL) 5 MG tablet     levothyroxine (SYNTHROID, LEVOTHROID) 50 MCG tablet  02/24/2015: Received from: External Pharmacy   Magnesium 250 MG TABS Take by mouth.    mometasone (NASONEX) 50 MCG/ACT  nasal spray 2 sprays by Each Nare route daily.    Multiple Vitamins-Minerals (CENTRUM SILVER PO) Take by mouth.    zinc gluconate 50 MG tablet Take 50 mg by mouth daily.    [DISCONTINUED] Calcium Carbonate (CALCIUM 600 PO) Take by mouth.    [DISCONTINUED] citalopram (CELEXA) 10 MG tablet  02/24/2015: Received from: External Pharmacy   No facility-administered encounter medications on file as of 08/02/2024.    Surgical History: Past Surgical History:  Procedure Laterality Date   ABDOMINAL HYSTERECTOMY     APPENDECTOMY     BREAST SURGERY     CESAREAN SECTION     TONSILLECTOMY AND ADENOIDECTOMY     VEIN SURGERY      Medical History: Past Medical History:  Diagnosis Date   Asthma    Diabetes mellitus without complication (HCC)    Osteoporosis    Sleep apnea    Thyroid disease     Family History: Non contributory to the present illness  Social History: Social History   Socioeconomic History   Marital status: Single    Spouse name: Not on file   Number of children: Not on file   Years of education: Not on file   Highest education level: Not on file  Occupational History   Not on file  Tobacco Use   Smoking status: Never   Smokeless tobacco: Never  Vaping Use   Vaping status: Never Used  Substance and Sexual Activity   Alcohol use: Never    Alcohol/week: 0.0 standard drinks of alcohol   Drug  use: Never   Sexual activity: Not on file  Other Topics Concern   Not on file  Social History Narrative   Not on file   Social Drivers of Health   Financial Resource Strain: Not on file  Food Insecurity: Not on file  Transportation Needs: Not on file  Physical Activity: Not on file  Stress: Not on file  Social Connections: Not on file  Intimate Partner Violence: Not on file    Vital Signs: There were no vitals taken for this visit. There is no height or weight on file to calculate BMI.    Examination: General Appearance: The patient is well-developed,  well-nourished, and in no distress. Neck Circumference: 35 cm Skin: Gross inspection of skin unremarkable. Head: normocephalic, no gross deformities. Eyes: no gross deformities noted. ENT: ears appear grossly normal Neurologic: Alert and oriented. No involuntary movements.  STOP BANG RISK ASSESSMENT S (snore) Have you been told that you snore?     NO   T (tired) Are you often tired, fatigued, or sleepy during the day?   NO  O (obstruction) Do you stop breathing, choke, or gasp during sleep? NO   P (pressure) Do you have or are you being treated for high blood pressure? YES   B (BMI) Is your body index greater than 35 kg/m? NO   A (age) Are you 32 years old or older? YES   N (neck) Do you have a neck circumference greater than 16 inches?   NO   G (gender) Are you a female? NO   TOTAL STOP/BANG "YES" ANSWERS 2       A STOP-Bang score of 2 or less is considered low risk, and a score of 5 or more is high risk for having either moderate or severe OSA. For people who score 3 or 4, doctors may need to perform further assessment to determine how likely they are to have OSA.         EPWORTH SLEEPINESS SCALE:  Scale:  (0)= no chance of dozing; (1)= slight chance of dozing; (2)= moderate chance of dozing; (3)= high chance of dozing  Chance  Situtation    Sitting and reading: 2    Watching TV: 1    Sitting Inactive in public: 1    As a passenger in car: 1      Lying down to rest: 3    Sitting and talking: 1    Sitting quielty after lunch: 3    In a car, stopped in traffic: 1   TOTAL SCORE:   13 out of 24    SLEEP STUDIES:  PSG (02/2014) AHI 6/hr, REM AHI /hr, RERA /hr, min SpO2 89% Titration (02/2014) APAP@ 5-12 cmH2O due to RERA clusters Titration (10/2023) CPAP@ 6 or remain on APAP@ 5-11 cmH2O. EVAL for PLM suggested 24.8/Hr PSG (03/2024) AHI 12.9/hr, supine AHI 27.4/hr, min SpO2 89%, PLMI 18.7 Titration (03/2024) BiPAP ST@ 12/6 cmH2O, RR 14 bpm.    CPAP  COMPLIANCE DATA:  Date Range: 06/01/2024-07/30/2024  Average Daily Use: 8 hours 4 minutes  Median Use: 8 hours 9 minutes  Compliance for > 4 Hours: 98%  AHI: 1.5 respiratory events per hour  Days Used: 59/60 days  Mask Leak: 11.3  95th Percentile Pressure: 12/6         LABS: No results found for this or any previous visit (from the past 2160 hours).  Radiology: No results found.  No results found.  No results found.    Assessment  and Plan: Patient Active Problem List   Diagnosis Date Noted   Diastolic dysfunction 03/06/2022   Obstructive sleep apnea 03/06/2022   1. Central sleep apnea with Cheyne-Stokes respiration (Primary) The patient does tolerate PAP and reports  benefit from PAP use. Her apnea is well controlled. She underwent cardiac and pulmonary evaluation because of her central apnea, both of which were negative. Neurological evaluation is being considered. The patient was reminded how to clean equipment  and advised to replace supplies routinely. The patient was also counselled on weigh tloss. The compliance is excellent. The AHI is 1.5.   Central sleep apnea- controlled on bipap ST. Continue with excellent compliance. F/u one year.   2. Encounter for BiPAP use counseling CPAP Counseling: had a lengthy discussion with the patient regarding the importance of PAP therapy in management of the sleep apnea. Patient appears to understand the risk factor reduction and also understands the risks associated with untreated sleep apnea. Patient will try to make a good faith effort to remain compliant with therapy. Also instructed the patient on proper cleaning of the device including the water must be changed daily if possible and use of distilled water is preferred. Patient understands that the machine should be regularly cleaned with appropriate recommended cleaning solutions that do not damage the PAP machine for example given white vinegar and water rinses. Other  methods such as ozone treatment may not be as good as these simple methods to achieve cleaning.   3. Hypersomnia Epworth is 13/24. She has symptoms of daytime sleepiness that date back to childhood. Discussed having evaluation for narcolepsy, she declines.     General Counseling: I have discussed the findings of the evaluation and examination with Kelsey Howard.  I have also discussed any further diagnostic evaluation thatmay be needed or ordered today. Kelsey Howard verbalizes understanding of the findings of todays visit. We also reviewed her medications today and discussed drug interactions and side effects including but not limited excessive drowsiness and altered mental states. We also discussed that there is always a risk not just to her but also people around her. she has been encouraged to call the office with any questions or concerns that should arise related to todays visit.  No orders of the defined types were placed in this encounter.       I have personally obtained a history, examined the patient, evaluated laboratory and imaging results, formulated the assessment and plan and placed orders. This patient was seen today by Lauraine Lay, PA-C in collaboration with Dr. Elfreda Bathe.   Elfreda DELENA Bathe, MD [facility] Diplomate ABMS Pulmonary Critical Care Medicine and Sleep Medicine

## 2024-08-02 ENCOUNTER — Ambulatory Visit (INDEPENDENT_AMBULATORY_CARE_PROVIDER_SITE_OTHER): Admitting: Internal Medicine

## 2024-08-02 VITALS — BP 128/81 | HR 64 | Resp 16 | Ht 63.0 in | Wt 170.0 lb

## 2024-08-02 DIAGNOSIS — G4710 Hypersomnia, unspecified: Secondary | ICD-10-CM

## 2024-08-02 DIAGNOSIS — R063 Periodic breathing: Secondary | ICD-10-CM

## 2024-08-02 DIAGNOSIS — Z7189 Other specified counseling: Secondary | ICD-10-CM

## 2024-08-02 NOTE — Patient Instructions (Signed)
